# Patient Record
Sex: Female | Born: 1940 | Race: White | Hispanic: No | Marital: Married | Smoking: Never smoker
Health system: Southern US, Community
[De-identification: ages and names within clinical notes are randomized; demographics above are authoritative.]

## PROBLEM LIST (undated history)

## (undated) DIAGNOSIS — E7800 Pure hypercholesterolemia, unspecified: Secondary | ICD-10-CM

## (undated) DIAGNOSIS — Z9889 Other specified postprocedural states: Secondary | ICD-10-CM

## (undated) DIAGNOSIS — I10 Essential (primary) hypertension: Secondary | ICD-10-CM

## (undated) DIAGNOSIS — T8859XA Other complications of anesthesia, initial encounter: Secondary | ICD-10-CM

## (undated) DIAGNOSIS — M810 Age-related osteoporosis without current pathological fracture: Secondary | ICD-10-CM

## (undated) DIAGNOSIS — K635 Polyp of colon: Secondary | ICD-10-CM

## (undated) DIAGNOSIS — M419 Scoliosis, unspecified: Secondary | ICD-10-CM

## (undated) DIAGNOSIS — R112 Nausea with vomiting, unspecified: Secondary | ICD-10-CM

## (undated) DIAGNOSIS — M5431 Sciatica, right side: Secondary | ICD-10-CM

## (undated) DIAGNOSIS — T4145XA Adverse effect of unspecified anesthetic, initial encounter: Secondary | ICD-10-CM

## (undated) DIAGNOSIS — E119 Type 2 diabetes mellitus without complications: Secondary | ICD-10-CM

## (undated) HISTORY — PX: OTHER SURGICAL HISTORY: SHX169

## (undated) HISTORY — PX: COLONOSCOPY: SHX174

## (undated) HISTORY — PX: ABDOMINAL HYSTERECTOMY: SHX81

---
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
## 2000-08-03 ENCOUNTER — Ambulatory Visit (HOSPITAL_COMMUNITY): Admission: RE | Admit: 2000-08-03 | Discharge: 2000-08-03 | Payer: Self-pay | Admitting: *Deleted

## 2000-08-03 ENCOUNTER — Encounter: Payer: Self-pay | Admitting: *Deleted

## 2001-12-18 ENCOUNTER — Encounter: Payer: Self-pay | Admitting: Family Medicine

## 2001-12-18 ENCOUNTER — Ambulatory Visit (HOSPITAL_COMMUNITY): Admission: RE | Admit: 2001-12-18 | Discharge: 2001-12-18 | Payer: Self-pay | Admitting: Family Medicine

## 2002-05-14 ENCOUNTER — Encounter: Payer: Self-pay | Admitting: Family Medicine

## 2002-05-14 ENCOUNTER — Ambulatory Visit (HOSPITAL_COMMUNITY): Admission: RE | Admit: 2002-05-14 | Discharge: 2002-05-14 | Payer: Self-pay | Admitting: Family Medicine

## 2003-05-05 ENCOUNTER — Ambulatory Visit (HOSPITAL_COMMUNITY): Admission: RE | Admit: 2003-05-05 | Discharge: 2003-05-05 | Payer: Self-pay | Admitting: Obstetrics and Gynecology

## 2004-07-04 ENCOUNTER — Ambulatory Visit (HOSPITAL_COMMUNITY): Admission: RE | Admit: 2004-07-04 | Discharge: 2004-07-04 | Payer: Self-pay | Admitting: Orthopaedic Surgery

## 2005-07-11 ENCOUNTER — Ambulatory Visit (HOSPITAL_COMMUNITY): Admission: RE | Admit: 2005-07-11 | Discharge: 2005-07-11 | Payer: Self-pay | Admitting: Family Medicine

## 2006-08-13 ENCOUNTER — Ambulatory Visit (HOSPITAL_COMMUNITY): Admission: RE | Admit: 2006-08-13 | Discharge: 2006-08-13 | Payer: Self-pay | Admitting: Family Medicine

## 2007-05-01 ENCOUNTER — Ambulatory Visit (HOSPITAL_COMMUNITY): Admission: RE | Admit: 2007-05-01 | Discharge: 2007-05-01 | Payer: Self-pay | Admitting: Family Medicine

## 2007-08-22 ENCOUNTER — Ambulatory Visit (HOSPITAL_COMMUNITY): Admission: RE | Admit: 2007-08-22 | Discharge: 2007-08-22 | Payer: Self-pay | Admitting: Family Medicine

## 2008-10-07 ENCOUNTER — Ambulatory Visit (HOSPITAL_COMMUNITY): Admission: RE | Admit: 2008-10-07 | Discharge: 2008-10-07 | Payer: Self-pay | Admitting: Family Medicine

## 2009-04-27 ENCOUNTER — Ambulatory Visit (HOSPITAL_COMMUNITY): Admission: RE | Admit: 2009-04-27 | Discharge: 2009-04-27 | Payer: Self-pay | Admitting: Family Medicine

## 2009-10-26 ENCOUNTER — Ambulatory Visit (HOSPITAL_COMMUNITY): Admission: RE | Admit: 2009-10-26 | Discharge: 2009-10-26 | Payer: Self-pay | Admitting: Family Medicine

## 2010-10-18 ENCOUNTER — Other Ambulatory Visit (HOSPITAL_BASED_OUTPATIENT_CLINIC_OR_DEPARTMENT_OTHER): Payer: Self-pay | Admitting: Family Medicine

## 2010-10-18 DIAGNOSIS — Z139 Encounter for screening, unspecified: Secondary | ICD-10-CM

## 2010-11-01 ENCOUNTER — Ambulatory Visit (HOSPITAL_COMMUNITY)
Admission: RE | Admit: 2010-11-01 | Discharge: 2010-11-01 | Disposition: A | Discharge status: Home / Self Care | Payer: Medicare Other | Source: Ambulatory Visit | Attending: Family Medicine | Admitting: Family Medicine

## 2010-11-01 DIAGNOSIS — Z1231 Encounter for screening mammogram for malignant neoplasm of breast: Secondary | ICD-10-CM | POA: Insufficient documentation

## 2010-11-01 DIAGNOSIS — Z139 Encounter for screening, unspecified: Secondary | ICD-10-CM

## 2011-06-22 ENCOUNTER — Other Ambulatory Visit (HOSPITAL_COMMUNITY): Payer: Self-pay | Admitting: Family Medicine

## 2011-06-22 DIAGNOSIS — Z139 Encounter for screening, unspecified: Secondary | ICD-10-CM

## 2011-06-22 DIAGNOSIS — D4860 Neoplasm of uncertain behavior of unspecified breast: Secondary | ICD-10-CM

## 2011-06-22 DIAGNOSIS — Z01419 Encounter for gynecological examination (general) (routine) without abnormal findings: Secondary | ICD-10-CM

## 2011-06-28 ENCOUNTER — Ambulatory Visit (HOSPITAL_COMMUNITY)
Admission: RE | Admit: 2011-06-28 | Discharge: 2011-06-28 | Disposition: A | Discharge status: Home / Self Care | Payer: Medicare Other | Source: Ambulatory Visit | Attending: Family Medicine | Admitting: Family Medicine

## 2011-06-28 DIAGNOSIS — Z01419 Encounter for gynecological examination (general) (routine) without abnormal findings: Secondary | ICD-10-CM

## 2011-06-28 DIAGNOSIS — Z13820 Encounter for screening for osteoporosis: Secondary | ICD-10-CM | POA: Insufficient documentation

## 2011-06-28 DIAGNOSIS — M818 Other osteoporosis without current pathological fracture: Secondary | ICD-10-CM | POA: Insufficient documentation

## 2011-06-28 DIAGNOSIS — Z780 Asymptomatic menopausal state: Secondary | ICD-10-CM | POA: Insufficient documentation

## 2011-06-28 DIAGNOSIS — Z139 Encounter for screening, unspecified: Secondary | ICD-10-CM

## 2011-07-05 ENCOUNTER — Ambulatory Visit (HOSPITAL_COMMUNITY)
Admission: RE | Admit: 2011-07-05 | Discharge: 2011-07-05 | Disposition: A | Discharge status: Home / Self Care | Payer: Medicare Other | Source: Ambulatory Visit | Attending: Family Medicine | Admitting: Family Medicine

## 2011-07-05 ENCOUNTER — Ambulatory Visit (HOSPITAL_COMMUNITY): Payer: Medicare Other

## 2011-07-05 ENCOUNTER — Other Ambulatory Visit (HOSPITAL_COMMUNITY): Payer: Self-pay | Admitting: Family Medicine

## 2011-07-05 DIAGNOSIS — D4860 Neoplasm of uncertain behavior of unspecified breast: Secondary | ICD-10-CM

## 2011-07-05 DIAGNOSIS — N63 Unspecified lump in unspecified breast: Secondary | ICD-10-CM | POA: Insufficient documentation

## 2011-10-10 ENCOUNTER — Other Ambulatory Visit (HOSPITAL_COMMUNITY): Payer: Self-pay | Admitting: Family Medicine

## 2011-10-10 DIAGNOSIS — Z139 Encounter for screening, unspecified: Secondary | ICD-10-CM

## 2011-11-06 ENCOUNTER — Ambulatory Visit (HOSPITAL_COMMUNITY)
Admission: RE | Admit: 2011-11-06 | Discharge: 2011-11-06 | Disposition: A | Discharge status: Home / Self Care | Payer: Medicare Other | Source: Ambulatory Visit | Attending: Family Medicine | Admitting: Family Medicine

## 2011-11-06 DIAGNOSIS — Z1231 Encounter for screening mammogram for malignant neoplasm of breast: Secondary | ICD-10-CM | POA: Insufficient documentation

## 2011-11-06 DIAGNOSIS — Z139 Encounter for screening, unspecified: Secondary | ICD-10-CM

## 2012-05-13 ENCOUNTER — Encounter (INDEPENDENT_AMBULATORY_CARE_PROVIDER_SITE_OTHER): Payer: Self-pay | Admitting: *Deleted

## 2012-05-14 ENCOUNTER — Encounter (INDEPENDENT_AMBULATORY_CARE_PROVIDER_SITE_OTHER): Payer: Self-pay

## 2012-07-18 ENCOUNTER — Telehealth (HOSPITAL_COMMUNITY): Payer: Self-pay | Admitting: Dietician

## 2012-07-18 NOTE — Telephone Encounter (Signed)
Received voicemail left at [phone]. Pt requesting to attend next Tuesday group DM class.

## 2012-07-18 NOTE — Telephone Encounter (Signed)
Called back at [phone]. Pt registered to attend group DM class on 07/30/12 at 1000.

## 2012-07-30 ENCOUNTER — Encounter (HOSPITAL_COMMUNITY): Payer: Self-pay | Admitting: Dietician

## 2012-07-30 NOTE — Progress Notes (Signed)
[facility] Diabetes Class Completion  Date:July 30, 2012  Time: 1000  Pt attended [facility]'s Diabetes Group Education Class on July 30, 2012.   Patient was educated on the following topics:   -Survival skills (signs and symptoms of hyperglycemia and hypoglycemia, treatment for hypoglycemia, ideal levels for fasting and postprandial blood sugars, goal Hgb A1c level, foot care basics)  -Recommendations for physical activity   -Carbohydrate metabolism in relation to diabetes   -Meal planning (sources of carbohydrate, carbohydrate counting, meal planning strategies, food label reading, and portion control).  Handouts provided:  -"Diabetes and You: Taking Charge of Your Health"  -"Carbohydrate Counting and Meal Planning"  -"Blood Sugar Diary"  -"Your Guide to Better Office Visits"   Shelia Pierce, RD, LDN   

## 2012-10-10 ENCOUNTER — Telehealth (INDEPENDENT_AMBULATORY_CARE_PROVIDER_SITE_OTHER): Payer: Self-pay | Admitting: *Deleted

## 2012-10-10 ENCOUNTER — Other Ambulatory Visit (INDEPENDENT_AMBULATORY_CARE_PROVIDER_SITE_OTHER): Payer: Self-pay | Admitting: *Deleted

## 2012-10-10 DIAGNOSIS — Z86010 Personal history of colonic polyps: Secondary | ICD-10-CM

## 2012-10-10 DIAGNOSIS — Z1211 Encounter for screening for malignant neoplasm of colon: Secondary | ICD-10-CM

## 2012-10-10 NOTE — Telephone Encounter (Signed)
Patient needs movi prep 

## 2012-10-11 MED ORDER — PEG-KCL-NACL-NASULF-NA ASC-C 100 G PO SOLR: 1.0000 | Freq: Once | ORAL | Status: DC

## 2012-10-29 ENCOUNTER — Other Ambulatory Visit (HOSPITAL_COMMUNITY): Payer: Self-pay | Admitting: Family Medicine

## 2012-10-29 DIAGNOSIS — Z139 Encounter for screening, unspecified: Secondary | ICD-10-CM

## 2012-10-31 ENCOUNTER — Telehealth (INDEPENDENT_AMBULATORY_CARE_PROVIDER_SITE_OTHER): Payer: Self-pay | Admitting: *Deleted

## 2012-10-31 NOTE — Telephone Encounter (Signed)
agree

## 2012-10-31 NOTE — Telephone Encounter (Signed)
  Procedure: tcs  Reason/Indication:  Hx polyps  Has patient had this procedure before?  Yes, 2009 -- scanned  If so, when, by whom and where?    Is there a family history of colon cancer?  no  Who?  What age when diagnosed?    Is patient diabetic?   Yes, diet controlled      Does patient have prosthetic heart valve?  no  Do you have a pacemaker?  no  Has patient ever had endocarditis? no  Has patient had joint replacement within last 12 months?  no  Does patient tend to be constipated or take laxatives? no  Is patient on Coumadin, Plavix and/or Aspirin? yes  Medications: asa 81 mg daily, vit d 1000 iu daily, fish oil 1000 mg bid, multi vit daily, red yeast rice 1200 mg daily  Allergies: nkda  Medication Adjustment: asa 2 days  Procedure date & time: 11/21/12 at 1030

## 2012-11-07 ENCOUNTER — Encounter (HOSPITAL_COMMUNITY): Payer: Self-pay | Admitting: Pharmacy Technician

## 2012-11-07 ENCOUNTER — Ambulatory Visit (HOSPITAL_COMMUNITY)
Admission: RE | Admit: 2012-11-07 | Discharge: 2012-11-07 | Disposition: A | Discharge status: Home / Self Care | Payer: Medicare Other | Source: Ambulatory Visit | Attending: Family Medicine | Admitting: Family Medicine

## 2012-11-07 DIAGNOSIS — Z1231 Encounter for screening mammogram for malignant neoplasm of breast: Secondary | ICD-10-CM | POA: Insufficient documentation

## 2012-11-07 DIAGNOSIS — Z139 Encounter for screening, unspecified: Secondary | ICD-10-CM

## 2012-11-21 ENCOUNTER — Encounter (HOSPITAL_COMMUNITY): Payer: Self-pay | Admitting: *Deleted

## 2012-11-21 ENCOUNTER — Ambulatory Visit (HOSPITAL_COMMUNITY)
Admission: RE | Admit: 2012-11-21 | Discharge: 2012-11-21 | Disposition: A | Discharge status: Home / Self Care | Payer: Medicare Other | Source: Ambulatory Visit | Attending: Internal Medicine | Admitting: Internal Medicine

## 2012-11-21 ENCOUNTER — Encounter (HOSPITAL_COMMUNITY)
Admission: RE | Disposition: A | Discharge status: Home / Self Care | Payer: Self-pay | Source: Ambulatory Visit | Attending: Internal Medicine

## 2012-11-21 DIAGNOSIS — Z01812 Encounter for preprocedural laboratory examination: Secondary | ICD-10-CM | POA: Insufficient documentation

## 2012-11-21 DIAGNOSIS — K5730 Diverticulosis of large intestine without perforation or abscess without bleeding: Secondary | ICD-10-CM | POA: Insufficient documentation

## 2012-11-21 DIAGNOSIS — D126 Benign neoplasm of colon, unspecified: Secondary | ICD-10-CM

## 2012-11-21 DIAGNOSIS — E119 Type 2 diabetes mellitus without complications: Secondary | ICD-10-CM | POA: Insufficient documentation

## 2012-11-21 DIAGNOSIS — K644 Residual hemorrhoidal skin tags: Secondary | ICD-10-CM

## 2012-11-21 DIAGNOSIS — Z860100 Personal history of colon polyps, unspecified: Secondary | ICD-10-CM | POA: Insufficient documentation

## 2012-11-21 HISTORY — DX: Polyp of colon: K63.5

## 2012-11-21 HISTORY — PX: COLONOSCOPY: SHX5424

## 2012-11-21 HISTORY — DX: Pure hypercholesterolemia, unspecified: E78.00

## 2012-11-21 HISTORY — DX: Type 2 diabetes mellitus without complications: E11.9

## 2012-11-21 SURGERY — COLONOSCOPY
Anesthesia: Moderate Sedation

## 2012-11-21 MED ORDER — MIDAZOLAM HCL 5 MG/5ML IJ SOLN
INTRAMUSCULAR | Status: AC
  Filled 2012-11-21: qty 10

## 2012-11-21 MED ORDER — SODIUM CHLORIDE 0.9 % IV SOLN
INTRAVENOUS | Status: DC
  Administered 2012-11-21: 10:00:00 via INTRAVENOUS

## 2012-11-21 MED ORDER — MEPERIDINE HCL 50 MG/ML IJ SOLN
INTRAMUSCULAR | Status: AC
  Filled 2012-11-21: qty 1

## 2012-11-21 MED ORDER — MIDAZOLAM HCL 5 MG/5ML IJ SOLN
INTRAMUSCULAR | Status: DC | PRN
  Administered 2012-11-21 (×2): 2 mg via INTRAVENOUS
  Administered 2012-11-21: 1 mg via INTRAVENOUS

## 2012-11-21 MED ORDER — STERILE WATER FOR IRRIGATION IR SOLN
Status: DC | PRN
  Administered 2012-11-21: 11:00:00

## 2012-11-21 MED ORDER — MEPERIDINE HCL 50 MG/ML IJ SOLN
INTRAMUSCULAR | Status: DC | PRN
  Administered 2012-11-21 (×2): 25 mg via INTRAVENOUS

## 2012-11-21 NOTE — Op Note (Signed)
COLONOSCOPY PROCEDURE REPORT  PATIENT:  Shelia Pierce  MR#:  [id number] Birthdate:  1940-07-23, 72 y.o., female Endoscopist:  Dr. Malissa Hippo, MD Referred By:  Dr. Colette Ribas, MD  Procedure Date: 11/21/2012  Procedure:   Colonoscopy  Indications:  Patient is 72 year old Caucasian female with history of colonic adenomas and is here for surveillance colonoscopy.  Informed Consent:  The procedure and risks were reviewed with the patient and informed consent was obtained.  Medications:  Demerol 50 mg IV Versed 5 mg IV  Description of procedure:  After a digital rectal exam was performed, that colonoscope was advanced from the anus through the rectum and colon to the area of the cecum, ileocecal valve and appendiceal orifice. The cecum was deeply intubated. These structures were well-seen and photographed for the record. From the level of the cecum and ileocecal valve, the scope was slowly and cautiously withdrawn. The mucosal surfaces were carefully surveyed utilizing scope tip to flexion to facilitate fold flattening as needed. The scope was pulled down into the rectum where a thorough exam including retroflexion was performed.  Findings:   Prep excellent. 3 mm polyp ablated via cold biopsy from the hepatic flexure. Scattered diverticula and sigmoid and descending colon. Normal rectal mucosa. Small hemorrhoids below the dentate line.    Therapeutic/Diagnostic Maneuvers Performed:  See above  Complications:  None  Cecal Withdrawal Time:  13  minutes  Impression:  Examination performed to cecum. 3 mm polyp ablated via cold biopsy from hepatic flexure. Left-sided diverticulosis. Small external hemorrhoids.  Recommendations:  Standard instructions given. I will contact patient with biopsy results and further recommendations.  REHMAN,NAJEEB U  11/21/2012 11:19 AM  CC: Dr. Phillips Odor, Chancy Hurter, MD & Dr. Bonnetta Barry ref. provider found

## 2012-11-21 NOTE — H&P (Signed)
Shelia Pierce is an 72 y.o. female.   Chief Complaint: Patient is here for colonoscopy. HPI: Patient is 72 year old Caucasian female with history of tubular adenomas and is for surveillance colonoscopy. Her last exam was in August 2009. She denies abdominal pain change in her bowel habits or rectal bleeding. Family history is negative for CRC.  Past Medical History  Diagnosis Date  . Hypercholesteremia   . Colon polyps   . Diabetes mellitus without complication     Diet controlled    Past Surgical History  Procedure Laterality Date  . Abdominal hysterectomy    . Colonoscopy    . Left foot    . Left breast biopsy      Family History  Problem Relation Age of Onset  . Colon cancer Neg Hx    Social History:  reports that she has never smoked. She does not have any smokeless tobacco history on file. She reports that she does not drink alcohol or use illicit drugs.  Allergies: No Known Allergies  Medications Prior to Admission  Medication Sig Dispense Refill  . aspirin EC 81 MG tablet Take 81 mg by mouth daily.      . cholecalciferol (VITAMIN D) 1000 UNITS tablet Take 1,000 Units by mouth daily.      Marland Kitchen ibuprofen (ADVIL,MOTRIN) 200 MG tablet Take 400 mg by mouth every 6 (six) hours as needed.      . Multiple Vitamin (MULTIVITAMIN WITH MINERALS) TABS tablet Take 1 tablet by mouth daily.      Marland Kitchen omega-3 acid ethyl esters (LOVAZA) 1 G capsule Take 2 g by mouth daily.      . peg 3350 powder (MOVIPREP) 100 G SOLR Take 1 kit (200 g total) by mouth once.  1 kit  0  . Red Yeast Rice Extract 600 MG CAPS Take 1,200 mg by mouth daily.        Results for orders placed during the hospital encounter of 11/21/12 (from the past 48 hour(s))  GLUCOSE, CAPILLARY     Status: Abnormal   Collection Time    11/21/12 10:03 AM      Result Value Range   Glucose-Capillary 105 (*) 70 - 99 mg/dL   Comment 1 Documented in Chart     No results found.  ROS  Blood pressure 167/83, pulse 63, temperature  97.8 F (36.6 C), temperature source Oral, resp. rate 15, height 5' (1.524 m), weight 159 lb (72.122 kg), SpO2 98.00%. Physical Exam  Constitutional: She appears well-developed and well-nourished.  HENT:  Mouth/Throat: Oropharynx is clear and moist.  Eyes: Conjunctivae are normal. No scleral icterus.  Neck: No thyromegaly present.  Cardiovascular: Normal rate, regular rhythm and normal heart sounds.   No murmur heard. Respiratory: Effort normal and breath sounds normal.  GI: Soft. She exhibits no distension and no mass. There is no tenderness.  Musculoskeletal: She exhibits no edema.  Lymphadenopathy:    She has no cervical adenopathy.  Neurological: She is alert.  Skin: Skin is warm and dry.     Assessment/Plan History of colonic adenomas. Surveillance colonoscopy.  REHMAN,NAJEEB U 11/21/2012, 10:45 AM

## 2012-11-26 ENCOUNTER — Encounter (HOSPITAL_COMMUNITY): Payer: Self-pay | Admitting: Internal Medicine

## 2012-12-06 ENCOUNTER — Encounter (INDEPENDENT_AMBULATORY_CARE_PROVIDER_SITE_OTHER): Payer: Self-pay | Admitting: *Deleted

## 2013-10-13 ENCOUNTER — Other Ambulatory Visit (HOSPITAL_COMMUNITY): Payer: Self-pay | Admitting: Family Medicine

## 2013-10-13 DIAGNOSIS — Z1231 Encounter for screening mammogram for malignant neoplasm of breast: Secondary | ICD-10-CM

## 2013-11-10 ENCOUNTER — Ambulatory Visit (HOSPITAL_COMMUNITY)
Admission: RE | Admit: 2013-11-10 | Discharge: 2013-11-10 | Disposition: A | Discharge status: Home / Self Care | Payer: Medicare Other | Source: Ambulatory Visit | Attending: Family Medicine | Admitting: Family Medicine

## 2013-11-10 DIAGNOSIS — Z1231 Encounter for screening mammogram for malignant neoplasm of breast: Secondary | ICD-10-CM | POA: Insufficient documentation

## 2014-05-05 DIAGNOSIS — H5213 Myopia, bilateral: Secondary | ICD-10-CM | POA: Diagnosis not present

## 2014-05-05 DIAGNOSIS — H524 Presbyopia: Secondary | ICD-10-CM | POA: Diagnosis not present

## 2014-05-05 DIAGNOSIS — H52223 Regular astigmatism, bilateral: Secondary | ICD-10-CM | POA: Diagnosis not present

## 2014-05-05 DIAGNOSIS — H25819 Combined forms of age-related cataract, unspecified eye: Secondary | ICD-10-CM | POA: Diagnosis not present

## 2014-07-21 ENCOUNTER — Other Ambulatory Visit (HOSPITAL_COMMUNITY): Payer: Self-pay | Admitting: Family Medicine

## 2014-07-21 DIAGNOSIS — Z1389 Encounter for screening for other disorder: Secondary | ICD-10-CM | POA: Diagnosis not present

## 2014-07-21 DIAGNOSIS — M8580 Other specified disorders of bone density and structure, unspecified site: Secondary | ICD-10-CM

## 2014-07-21 DIAGNOSIS — Z6833 Body mass index (BMI) 33.0-33.9, adult: Secondary | ICD-10-CM | POA: Diagnosis not present

## 2014-07-21 DIAGNOSIS — E6609 Other obesity due to excess calories: Secondary | ICD-10-CM | POA: Diagnosis not present

## 2014-07-21 DIAGNOSIS — E782 Mixed hyperlipidemia: Secondary | ICD-10-CM | POA: Diagnosis not present

## 2014-07-21 DIAGNOSIS — Z0001 Encounter for general adult medical examination with abnormal findings: Secondary | ICD-10-CM | POA: Diagnosis not present

## 2014-07-21 DIAGNOSIS — E119 Type 2 diabetes mellitus without complications: Secondary | ICD-10-CM | POA: Diagnosis not present

## 2014-07-21 DIAGNOSIS — I10 Essential (primary) hypertension: Secondary | ICD-10-CM | POA: Diagnosis not present

## 2014-07-29 ENCOUNTER — Ambulatory Visit (HOSPITAL_COMMUNITY)
Admission: RE | Admit: 2014-07-29 | Discharge: 2014-07-29 | Disposition: A | Discharge status: Home / Self Care | Payer: Medicare Other | Source: Ambulatory Visit | Attending: Family Medicine | Admitting: Family Medicine

## 2014-07-29 DIAGNOSIS — Z780 Asymptomatic menopausal state: Secondary | ICD-10-CM | POA: Diagnosis not present

## 2014-07-29 DIAGNOSIS — Z1389 Encounter for screening for other disorder: Secondary | ICD-10-CM

## 2014-07-29 DIAGNOSIS — M8580 Other specified disorders of bone density and structure, unspecified site: Secondary | ICD-10-CM | POA: Diagnosis not present

## 2014-07-29 DIAGNOSIS — M810 Age-related osteoporosis without current pathological fracture: Secondary | ICD-10-CM | POA: Insufficient documentation

## 2014-07-29 DIAGNOSIS — R29890 Loss of height: Secondary | ICD-10-CM | POA: Insufficient documentation

## 2014-10-19 ENCOUNTER — Other Ambulatory Visit (HOSPITAL_COMMUNITY): Payer: Self-pay | Admitting: Family Medicine

## 2014-10-19 DIAGNOSIS — Z1231 Encounter for screening mammogram for malignant neoplasm of breast: Secondary | ICD-10-CM

## 2014-10-26 DIAGNOSIS — E119 Type 2 diabetes mellitus without complications: Secondary | ICD-10-CM | POA: Diagnosis not present

## 2014-10-26 DIAGNOSIS — E782 Mixed hyperlipidemia: Secondary | ICD-10-CM | POA: Diagnosis not present

## 2014-10-26 DIAGNOSIS — Z6830 Body mass index (BMI) 30.0-30.9, adult: Secondary | ICD-10-CM | POA: Diagnosis not present

## 2014-10-26 DIAGNOSIS — Z23 Encounter for immunization: Secondary | ICD-10-CM | POA: Diagnosis not present

## 2014-10-26 DIAGNOSIS — E6609 Other obesity due to excess calories: Secondary | ICD-10-CM | POA: Diagnosis not present

## 2014-10-26 DIAGNOSIS — I10 Essential (primary) hypertension: Secondary | ICD-10-CM | POA: Diagnosis not present

## 2014-10-26 DIAGNOSIS — Z1389 Encounter for screening for other disorder: Secondary | ICD-10-CM | POA: Diagnosis not present

## 2014-11-13 ENCOUNTER — Ambulatory Visit (HOSPITAL_COMMUNITY)
Admission: RE | Admit: 2014-11-13 | Discharge: 2014-11-13 | Disposition: A | Discharge status: Home / Self Care | Payer: Medicare Other | Source: Ambulatory Visit | Attending: Family Medicine | Admitting: Family Medicine

## 2014-11-13 DIAGNOSIS — Z1231 Encounter for screening mammogram for malignant neoplasm of breast: Secondary | ICD-10-CM | POA: Diagnosis not present

## 2015-05-07 DIAGNOSIS — E782 Mixed hyperlipidemia: Secondary | ICD-10-CM | POA: Diagnosis not present

## 2015-05-07 DIAGNOSIS — Z1389 Encounter for screening for other disorder: Secondary | ICD-10-CM | POA: Diagnosis not present

## 2015-05-07 DIAGNOSIS — E119 Type 2 diabetes mellitus without complications: Secondary | ICD-10-CM | POA: Diagnosis not present

## 2015-05-07 DIAGNOSIS — I10 Essential (primary) hypertension: Secondary | ICD-10-CM | POA: Diagnosis not present

## 2015-05-10 DIAGNOSIS — Z1389 Encounter for screening for other disorder: Secondary | ICD-10-CM | POA: Diagnosis not present

## 2015-05-10 DIAGNOSIS — E6609 Other obesity due to excess calories: Secondary | ICD-10-CM | POA: Diagnosis not present

## 2015-05-10 DIAGNOSIS — Z6831 Body mass index (BMI) 31.0-31.9, adult: Secondary | ICD-10-CM | POA: Diagnosis not present

## 2015-05-10 DIAGNOSIS — I10 Essential (primary) hypertension: Secondary | ICD-10-CM | POA: Diagnosis not present

## 2015-10-29 ENCOUNTER — Other Ambulatory Visit (HOSPITAL_COMMUNITY): Payer: Self-pay | Admitting: Family Medicine

## 2015-10-29 DIAGNOSIS — Z1231 Encounter for screening mammogram for malignant neoplasm of breast: Secondary | ICD-10-CM

## 2015-11-15 ENCOUNTER — Ambulatory Visit (HOSPITAL_COMMUNITY)
Admission: RE | Admit: 2015-11-15 | Discharge: 2015-11-15 | Disposition: A | Discharge status: Home / Self Care | Payer: Medicare Other | Source: Ambulatory Visit | Attending: Family Medicine | Admitting: Family Medicine

## 2015-11-15 DIAGNOSIS — Z1231 Encounter for screening mammogram for malignant neoplasm of breast: Secondary | ICD-10-CM | POA: Diagnosis not present

## 2015-11-16 DIAGNOSIS — H52223 Regular astigmatism, bilateral: Secondary | ICD-10-CM | POA: Diagnosis not present

## 2015-11-16 DIAGNOSIS — H5202 Hypermetropia, left eye: Secondary | ICD-10-CM | POA: Diagnosis not present

## 2015-11-16 DIAGNOSIS — H5211 Myopia, right eye: Secondary | ICD-10-CM | POA: Diagnosis not present

## 2015-11-16 DIAGNOSIS — E119 Type 2 diabetes mellitus without complications: Secondary | ICD-10-CM | POA: Diagnosis not present

## 2015-11-26 DIAGNOSIS — E782 Mixed hyperlipidemia: Secondary | ICD-10-CM | POA: Diagnosis not present

## 2015-11-26 DIAGNOSIS — Z1389 Encounter for screening for other disorder: Secondary | ICD-10-CM | POA: Diagnosis not present

## 2015-11-26 DIAGNOSIS — E119 Type 2 diabetes mellitus without complications: Secondary | ICD-10-CM | POA: Diagnosis not present

## 2015-11-26 DIAGNOSIS — Z0000 Encounter for general adult medical examination without abnormal findings: Secondary | ICD-10-CM | POA: Diagnosis not present

## 2016-08-10 DIAGNOSIS — Z1389 Encounter for screening for other disorder: Secondary | ICD-10-CM | POA: Diagnosis not present

## 2016-08-10 DIAGNOSIS — E782 Mixed hyperlipidemia: Secondary | ICD-10-CM | POA: Diagnosis not present

## 2016-08-10 DIAGNOSIS — E119 Type 2 diabetes mellitus without complications: Secondary | ICD-10-CM | POA: Diagnosis not present

## 2016-08-14 DIAGNOSIS — Z1389 Encounter for screening for other disorder: Secondary | ICD-10-CM | POA: Diagnosis not present

## 2016-08-14 DIAGNOSIS — E782 Mixed hyperlipidemia: Secondary | ICD-10-CM | POA: Diagnosis not present

## 2016-10-06 ENCOUNTER — Other Ambulatory Visit (HOSPITAL_COMMUNITY): Payer: Self-pay | Admitting: Family Medicine

## 2016-10-06 DIAGNOSIS — Z1231 Encounter for screening mammogram for malignant neoplasm of breast: Secondary | ICD-10-CM

## 2016-10-25 DIAGNOSIS — Z23 Encounter for immunization: Secondary | ICD-10-CM | POA: Diagnosis not present

## 2016-11-27 ENCOUNTER — Ambulatory Visit (HOSPITAL_COMMUNITY): Payer: Medicare Other

## 2016-11-29 ENCOUNTER — Ambulatory Visit (HOSPITAL_COMMUNITY)
Admission: RE | Admit: 2016-11-29 | Discharge: 2016-11-29 | Disposition: A | Discharge status: Home / Self Care | Payer: Medicare Other | Source: Ambulatory Visit | Attending: Family Medicine | Admitting: Family Medicine

## 2016-11-29 DIAGNOSIS — Z1231 Encounter for screening mammogram for malignant neoplasm of breast: Secondary | ICD-10-CM | POA: Diagnosis not present

## 2017-03-22 DIAGNOSIS — E119 Type 2 diabetes mellitus without complications: Secondary | ICD-10-CM | POA: Diagnosis not present

## 2017-03-22 DIAGNOSIS — H25813 Combined forms of age-related cataract, bilateral: Secondary | ICD-10-CM | POA: Diagnosis not present

## 2017-03-27 DIAGNOSIS — E1129 Type 2 diabetes mellitus with other diabetic kidney complication: Secondary | ICD-10-CM | POA: Diagnosis not present

## 2017-04-09 DIAGNOSIS — K648 Other hemorrhoids: Secondary | ICD-10-CM | POA: Diagnosis not present

## 2017-04-09 DIAGNOSIS — K625 Hemorrhage of anus and rectum: Secondary | ICD-10-CM | POA: Diagnosis not present

## 2017-04-09 DIAGNOSIS — Z1389 Encounter for screening for other disorder: Secondary | ICD-10-CM | POA: Diagnosis not present

## 2017-04-19 DIAGNOSIS — M4120 Other idiopathic scoliosis, site unspecified: Secondary | ICD-10-CM | POA: Diagnosis not present

## 2017-04-19 DIAGNOSIS — Z1389 Encounter for screening for other disorder: Secondary | ICD-10-CM | POA: Diagnosis not present

## 2017-04-19 DIAGNOSIS — Z0001 Encounter for general adult medical examination with abnormal findings: Secondary | ICD-10-CM | POA: Diagnosis not present

## 2017-04-19 DIAGNOSIS — E7849 Other hyperlipidemia: Secondary | ICD-10-CM | POA: Diagnosis not present

## 2017-04-19 DIAGNOSIS — E119 Type 2 diabetes mellitus without complications: Secondary | ICD-10-CM | POA: Diagnosis not present

## 2017-04-19 DIAGNOSIS — I10 Essential (primary) hypertension: Secondary | ICD-10-CM | POA: Diagnosis not present

## 2017-04-23 ENCOUNTER — Telehealth (INDEPENDENT_AMBULATORY_CARE_PROVIDER_SITE_OTHER): Payer: Self-pay | Admitting: Internal Medicine

## 2017-04-23 NOTE — Telephone Encounter (Signed)
Patient has an appt this week -  her insurance agent wanted a call back in ref to her [employer] - wanted to know if patient sees Deberah Castle would patient be billed under Dr Laural Golden such as a Tier 1 - his ph# [phone]

## 2017-04-24 DIAGNOSIS — E119 Type 2 diabetes mellitus without complications: Secondary | ICD-10-CM | POA: Diagnosis not present

## 2017-04-24 NOTE — Telephone Encounter (Signed)
Returned call to Shelia Pierce--LMOAM  Normally patients that have [facility] if NP sees patient it is billed under Dr. Laural Golden as billing and NP as rendering.  If this is not how they will pay Tier 1 then we will have to find an appt with Dr. Laural Golden.  I asked him to call back--he can leave me a message if any questions.

## 2017-04-24 NOTE — Telephone Encounter (Signed)
Ok, thank you

## 2017-04-25 ENCOUNTER — Encounter (INDEPENDENT_AMBULATORY_CARE_PROVIDER_SITE_OTHER): Payer: Self-pay | Admitting: Internal Medicine

## 2017-04-25 ENCOUNTER — Other Ambulatory Visit (HOSPITAL_COMMUNITY): Payer: Self-pay | Admitting: Family Medicine

## 2017-04-25 ENCOUNTER — Ambulatory Visit (INDEPENDENT_AMBULATORY_CARE_PROVIDER_SITE_OTHER): Payer: Medicare Other | Admitting: Internal Medicine

## 2017-04-25 VITALS — BP 164/90 | HR 68 | Temp 98.0°F | Ht 60.0 in | Wt 167.0 lb

## 2017-04-25 DIAGNOSIS — K625 Hemorrhage of anus and rectum: Secondary | ICD-10-CM | POA: Diagnosis not present

## 2017-04-25 DIAGNOSIS — E2839 Other primary ovarian failure: Secondary | ICD-10-CM

## 2017-04-25 IMAGING — MG 2D DIGITAL SCREENING BILATERAL MAMMOGRAM WITH CAD AND ADJUNCT TO
6 of 9 series · 6 of 25 positions shown · non-contrast
Comparison: Previous exam(s).

CLINICAL DATA: Screening.

EXAM:
2D DIGITAL SCREENING BILATERAL MAMMOGRAM WITH CAD AND ADJUNCT TOMO

[R MLO (1 of 2)]
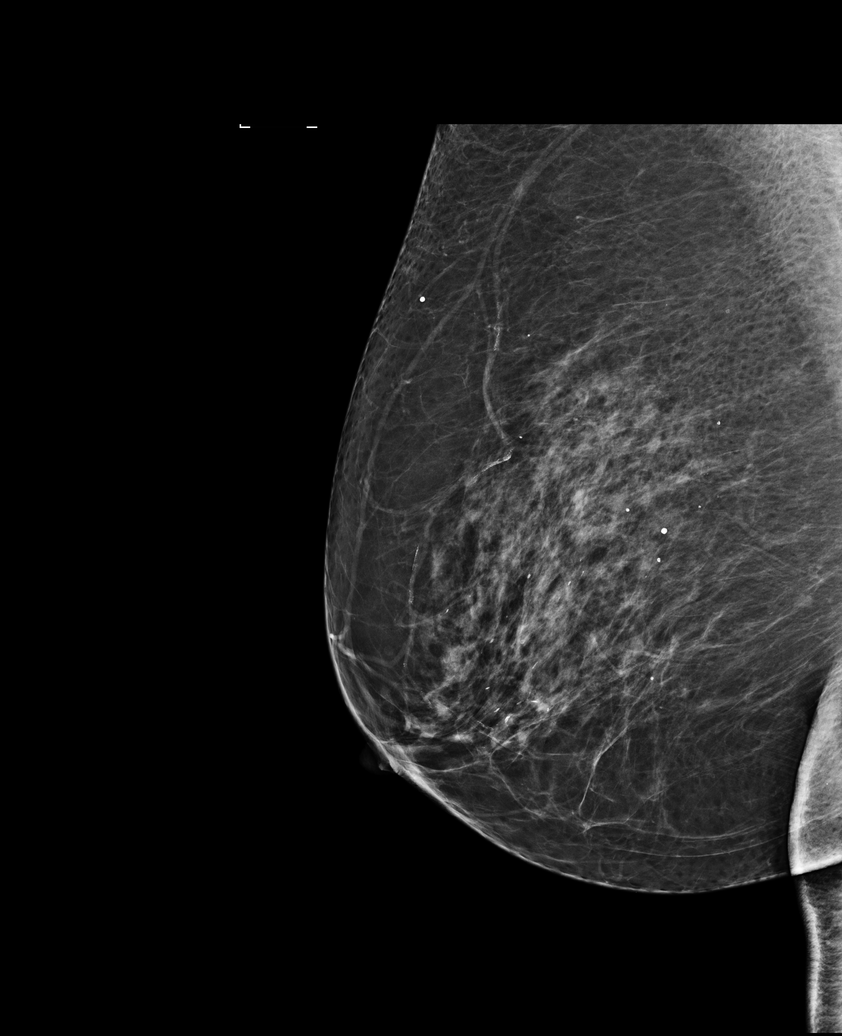

[L MLO]
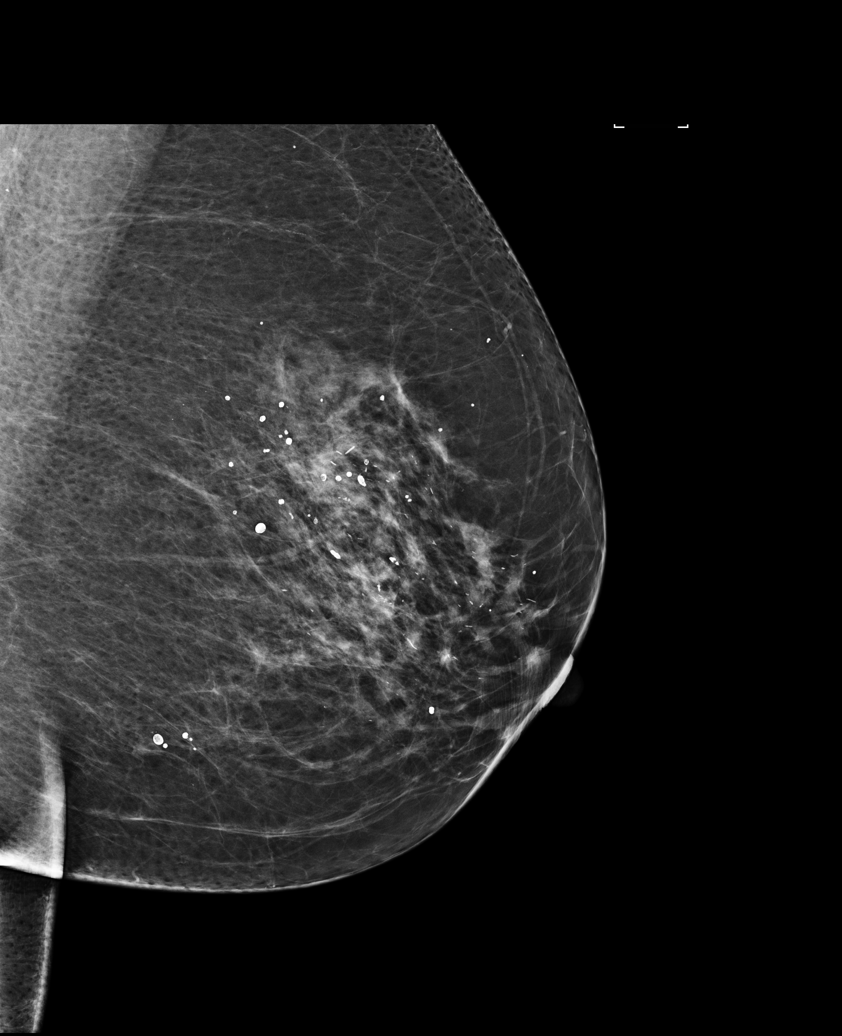

[R MLO (2 of 2)]
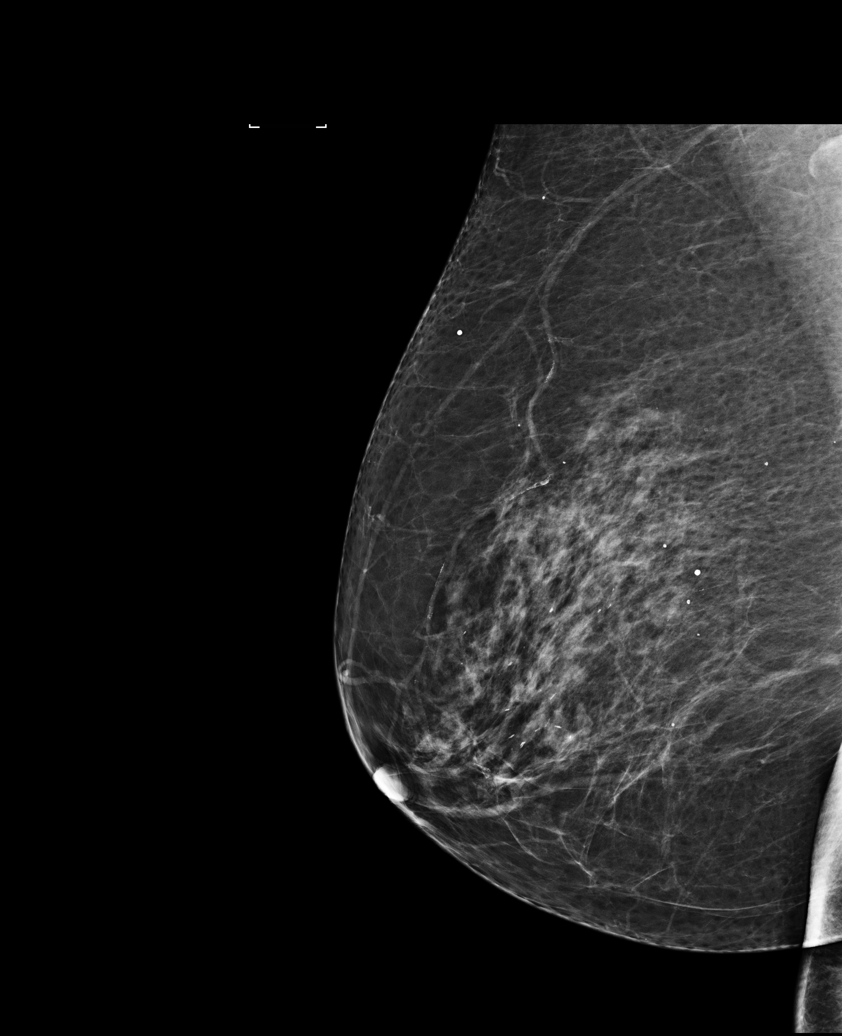

[R CC]
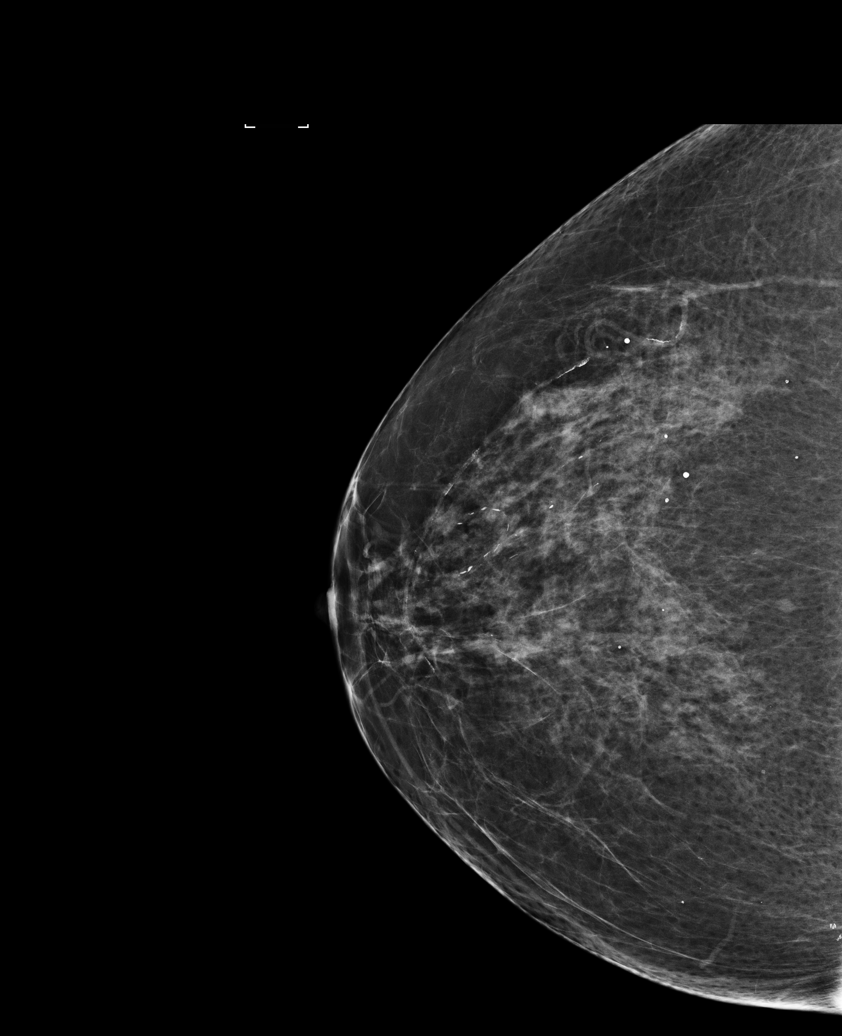

[L CC]
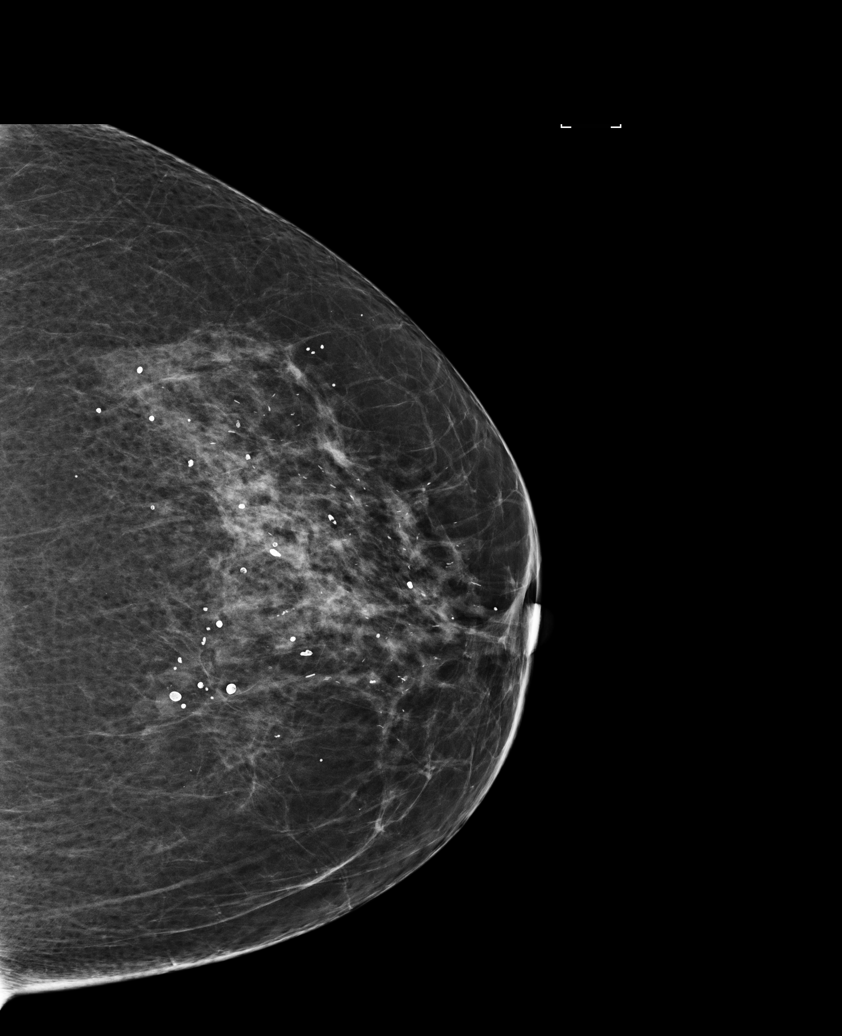

[R CC tomo · tomo slice 37/72.0]
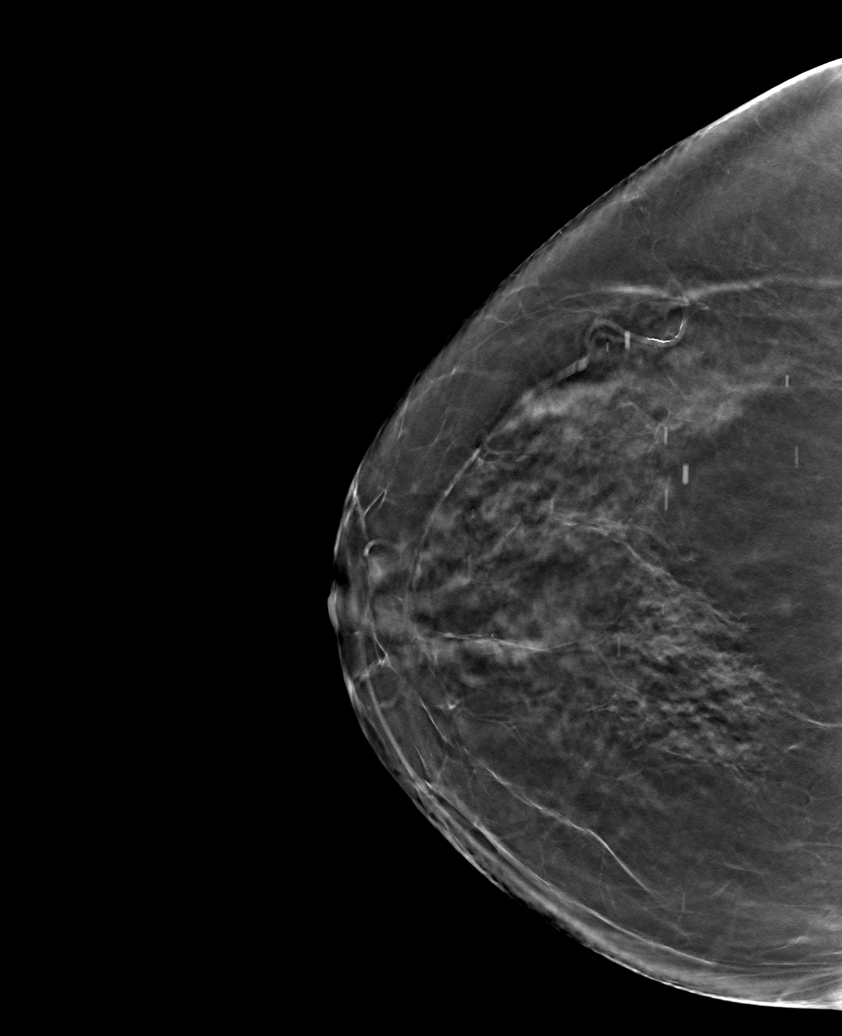

[6 of 25 positions shown; findings below may reference images not displayed]

ACR Breast Density Category b: There are scattered areas of
fibroglandular density.
FINDINGS: There are no findings suspicious for malignancy. Images were
processed with CAD.
IMPRESSION: No mammographic evidence of malignancy. A result letter of this
screening mammogram will be mailed directly to the patient.

RECOMMENDATION:
Screening mammogram in one year. (Code:97-6-RS4)

BI-RADS CATEGORY  1: Negative.

## 2017-04-25 NOTE — Progress Notes (Signed)
   Subjective:    Patient ID: Shelia Pierce, female    DOB: 13-Mar-1940, 77 y.o.   MRN: [id number]  HPI Referred by Dr. Gerarda Fraction for rectal bleeding/colonoscopy. Her last colonoscopy was in November of 2014 for hx of colonic adenomas.  Biopsy: No adenomatous changes. Next colonoscopy in 7 yrs.  She tells me she has some rectal bleeding about 2 weeks. She saw blood when she on the toilet tissue. She has seen blood a couple of time. There was no straining.  No family hx of colon cancer. Her appetite is good. No weight loss. BMs are normal.   Review of Systems     Past Medical History:  Diagnosis Date  . Colon polyps   . Diabetes mellitus without complication ([place])    Diet controlled  . Hypercholesteremia     Past Surgical History:  Procedure Laterality Date  . ABDOMINAL HYSTERECTOMY    . COLONOSCOPY    . COLONOSCOPY N/A 11/21/2012   Procedure: COLONOSCOPY;  Surgeon: Rogene Houston, MD;  Location: [facility];  Service: Endoscopy;  Laterality: N/A;  1030  . Left breast biopsy    . Left foot      No Known Allergies  Current Outpatient Medications on File Prior to Visit  Medication Sig Dispense Refill  . aspirin EC 81 MG tablet Take 81 mg by mouth daily.    . calcium-vitamin D (OSCAL WITH D) 500-200 MG-UNIT tablet Take 1 tablet by mouth.    Marland Kitchen lisinopril (PRINIVIL,ZESTRIL) 10 MG tablet Take 10 mg by mouth daily.    . metFORMIN (GLUCOPHAGE) 500 MG tablet Take by mouth 2 (two) times daily with a meal.    . Multiple Vitamin (MULTIVITAMIN WITH MINERALS) TABS tablet Take 1 tablet by mouth daily.    Marland Kitchen omega-3 acid ethyl esters (LOVAZA) 1 G capsule Take 2 g by mouth daily.    . simvastatin (ZOCOR) 10 MG tablet Take 10 mg by mouth daily.    . vitamin A 8000 UNIT capsule Take 8,000 Units by mouth daily.    . vitamin B-12 (CYANOCOBALAMIN) 500 MCG tablet Take 500 mcg by mouth daily.    . vitamin C (ASCORBIC ACID) 500 MG tablet Take 500 mg by mouth daily.    . cholecalciferol (VITAMIN D)  1000 UNITS tablet Take 1,000 Units by mouth daily.    Marland Kitchen ibuprofen (ADVIL,MOTRIN) 200 MG tablet Take 400 mg by mouth every 6 (six) hours as needed.     No current facility-administered medications on file prior to visit.      Objective:   Physical Exam Blood pressure (!) 164/90, pulse 68, temperature 98 F (36.7 C), height 5' (1.524 m), weight 167 lb (75.8 kg). Alert and oriented. Skin warm and dry. Oral mucosa is moist.   . Sclera anicteric, conjunctivae is pink. Thyroid not enlarged. No cervical lymphadenopathy. Lungs clear. Heart regular rate and rhythm.  Abdomen is soft. Bowel sounds are positive. No hepatomegaly. No abdominal masses felt. No tenderness.  No edema to lower extremities.   Stool brown and guaiac negative.        Assessment & Plan:  Rectal bleeding. Colonic neoplasm needs to be ruled out. The risks of bleeding, perforation and infection were reviewed with patient.

## 2017-04-25 NOTE — Patient Instructions (Signed)
The risks of bleeding, perforation and infection were reviewed with patient.  

## 2017-04-26 ENCOUNTER — Encounter (INDEPENDENT_AMBULATORY_CARE_PROVIDER_SITE_OTHER): Payer: Self-pay | Admitting: *Deleted

## 2017-04-26 ENCOUNTER — Telehealth (INDEPENDENT_AMBULATORY_CARE_PROVIDER_SITE_OTHER): Payer: Self-pay | Admitting: *Deleted

## 2017-04-26 DIAGNOSIS — K625 Hemorrhage of anus and rectum: Secondary | ICD-10-CM | POA: Insufficient documentation

## 2017-04-26 MED ORDER — PEG 3350-KCL-NA BICARB-NACL 420 G PO SOLR
4000.0000 mL | Freq: Once | ORAL | 0 refills | Status: AC
Start: 2017-04-26 — End: 2017-04-26

## 2017-04-26 NOTE — Telephone Encounter (Signed)
Patient needs trilyte 

## 2017-05-02 ENCOUNTER — Ambulatory Visit (HOSPITAL_COMMUNITY)
Admission: RE | Admit: 2017-05-02 | Discharge: 2017-05-02 | Disposition: A | Discharge status: Home / Self Care | Payer: Medicare Other | Source: Ambulatory Visit | Attending: Family Medicine | Admitting: Family Medicine

## 2017-05-02 DIAGNOSIS — E2839 Other primary ovarian failure: Secondary | ICD-10-CM

## 2017-05-02 DIAGNOSIS — M810 Age-related osteoporosis without current pathological fracture: Secondary | ICD-10-CM | POA: Diagnosis not present

## 2017-05-02 DIAGNOSIS — M8589 Other specified disorders of bone density and structure, multiple sites: Secondary | ICD-10-CM | POA: Diagnosis not present

## 2017-05-25 ENCOUNTER — Encounter (HOSPITAL_COMMUNITY)
Admission: RE | Disposition: A | Discharge status: Home / Self Care | Payer: Self-pay | Source: Ambulatory Visit | Attending: Internal Medicine

## 2017-05-25 ENCOUNTER — Encounter (HOSPITAL_COMMUNITY): Payer: Self-pay | Admitting: Emergency Medicine

## 2017-05-25 ENCOUNTER — Other Ambulatory Visit: Payer: Self-pay

## 2017-05-25 ENCOUNTER — Ambulatory Visit (HOSPITAL_COMMUNITY)
Admission: RE | Admit: 2017-05-25 | Discharge: 2017-05-25 | Disposition: A | Discharge status: Home / Self Care | Payer: Medicare Other | Source: Ambulatory Visit | Attending: Internal Medicine | Admitting: Internal Medicine

## 2017-05-25 DIAGNOSIS — Z7984 Long term (current) use of oral hypoglycemic drugs: Secondary | ICD-10-CM | POA: Insufficient documentation

## 2017-05-25 DIAGNOSIS — Z7982 Long term (current) use of aspirin: Secondary | ICD-10-CM | POA: Insufficient documentation

## 2017-05-25 DIAGNOSIS — K648 Other hemorrhoids: Secondary | ICD-10-CM | POA: Diagnosis not present

## 2017-05-25 DIAGNOSIS — K5730 Diverticulosis of large intestine without perforation or abscess without bleeding: Secondary | ICD-10-CM | POA: Insufficient documentation

## 2017-05-25 DIAGNOSIS — Z800 Family history of malignant neoplasm of digestive organs: Secondary | ICD-10-CM | POA: Diagnosis not present

## 2017-05-25 DIAGNOSIS — Z86010 Personal history of colonic polyps: Secondary | ICD-10-CM | POA: Diagnosis not present

## 2017-05-25 DIAGNOSIS — E119 Type 2 diabetes mellitus without complications: Secondary | ICD-10-CM | POA: Diagnosis not present

## 2017-05-25 DIAGNOSIS — R194 Change in bowel habit: Secondary | ICD-10-CM | POA: Diagnosis not present

## 2017-05-25 DIAGNOSIS — E7800 Pure hypercholesterolemia, unspecified: Secondary | ICD-10-CM | POA: Insufficient documentation

## 2017-05-25 DIAGNOSIS — I10 Essential (primary) hypertension: Secondary | ICD-10-CM | POA: Insufficient documentation

## 2017-05-25 DIAGNOSIS — K625 Hemorrhage of anus and rectum: Secondary | ICD-10-CM | POA: Diagnosis not present

## 2017-05-25 DIAGNOSIS — Z79899 Other long term (current) drug therapy: Secondary | ICD-10-CM | POA: Diagnosis not present

## 2017-05-25 DIAGNOSIS — K921 Melena: Secondary | ICD-10-CM | POA: Diagnosis not present

## 2017-05-25 HISTORY — DX: Essential (primary) hypertension: I10

## 2017-05-25 HISTORY — PX: COLONOSCOPY: SHX5424

## 2017-05-25 LAB — GLUCOSE, CAPILLARY: Glucose-Capillary: 121 mg/dL — ABNORMAL HIGH (ref 65–99)

## 2017-05-25 SURGERY — COLONOSCOPY
Anesthesia: Moderate Sedation

## 2017-05-25 MED ORDER — HYDROCORTISONE ACETATE 25 MG RE SUPP: 25.0000 mg | Freq: Every day | RECTAL | 1 refills | Status: DC

## 2017-05-25 MED ORDER — MIDAZOLAM HCL 5 MG/5ML IJ SOLN
INTRAMUSCULAR | Status: DC | PRN
  Administered 2017-05-25: 2 mg via INTRAVENOUS
  Administered 2017-05-25 (×2): 1 mg via INTRAVENOUS
  Administered 2017-05-25: 2 mg via INTRAVENOUS

## 2017-05-25 MED ORDER — SODIUM CHLORIDE 0.9 % IV SOLN: INTRAVENOUS | Status: DC

## 2017-05-25 MED ORDER — BENEFIBER DRINK MIX PO PACK: 4.0000 g | PACK | Freq: Every day | ORAL | Status: DC

## 2017-05-25 MED ORDER — STERILE WATER FOR IRRIGATION IR SOLN
Status: DC | PRN
  Administered 2017-05-25: 15 mL

## 2017-05-25 MED ORDER — MEPERIDINE HCL 50 MG/ML IJ SOLN
INTRAMUSCULAR | Status: AC
  Filled 2017-05-25: qty 1

## 2017-05-25 MED ORDER — MIDAZOLAM HCL 5 MG/5ML IJ SOLN
INTRAMUSCULAR | Status: AC
  Filled 2017-05-25: qty 10

## 2017-05-25 MED ORDER — MEPERIDINE HCL 50 MG/ML IJ SOLN
INTRAMUSCULAR | Status: DC | PRN
  Administered 2017-05-25 (×2): 25 mg via INTRAVENOUS

## 2017-05-25 NOTE — H&P (Signed)
Shelia Pierce is an 77 y.o. female.   Chief Complaint: Patient is here for colonoscopy. HPI: Patient is 77 year old Caucasian female who presents with a few week history of change in her bowel habits.  She also has noted blood in the tissue.  She is to have one formed stool daily.  Now she is having she has had loose stools as well.  Tends to have bowel movement after meals.  Denies anorexia or weight loss or abdominal pain.  No history of recent antibiotic use. Last colonoscopy was in 2014.  She was found to have hyperplastic polyp.  She has had adenomas on prior colonoscopy.  She was therefore advised to return in 7 years which would have been 2 years from now. Family history is negative for CRC.  Past Medical History:  Diagnosis Date  . Colon polyps   . Diabetes mellitus without complication ([place])    Diet controlled  . Hypercholesteremia   . Hypertension     Past Surgical History:  Procedure Laterality Date  . ABDOMINAL HYSTERECTOMY    . COLONOSCOPY    . COLONOSCOPY N/A 11/21/2012   Procedure: COLONOSCOPY;  Surgeon: Rogene Houston, MD;  Location: [facility];  Service: Endoscopy;  Laterality: N/A;  1030  . Left breast biopsy    . Left foot      Family History  Problem Relation Age of Onset  . Colon cancer Neg Hx    Social History:  reports that she has never smoked. She has never used smokeless tobacco. She reports that she does not drink alcohol or use drugs.  Allergies: No Known Allergies  Medications Prior to Admission  Medication Sig Dispense Refill  . ARTIFICIAL TEAR OP Place 2 drops into both eyes daily as needed (for dry eyes).    Marland Kitchen aspirin EC 81 MG tablet Take 81 mg by mouth daily.    Marland Kitchen CALCIUM CARBONATE-VITAMIN D PO Take 1 tablet by mouth daily.     Marland Kitchen ibuprofen (ADVIL,MOTRIN) 200 MG tablet Take 400 mg by mouth every 6 (six) hours as needed for headache or moderate pain.     Marland Kitchen lisinopril (PRINIVIL,ZESTRIL) 10 MG tablet Take 10 mg by mouth daily.    . metFORMIN  (GLUCOPHAGE-XR) 500 MG 24 hr tablet Take 1,000 mg by mouth daily.    . Multiple Vitamin (MULTIVITAMIN WITH MINERALS) TABS tablet Take 1 tablet by mouth daily.    . Omega-3 Fatty Acids (FISH OIL PO) Take 1 capsule by mouth daily.    . Red Yeast Rice 600 MG CAPS Take 600 mg by mouth daily.    . simvastatin (ZOCOR) 10 MG tablet Take 10 mg by mouth daily.    . vitamin A 8000 UNIT capsule Take 8,000 Units by mouth daily.    . vitamin B-12 (CYANOCOBALAMIN) 500 MCG tablet Take 500 mcg by mouth daily.    . vitamin C (ASCORBIC ACID) 500 MG tablet Take 500 mg by mouth daily.      Results for orders placed or performed during the hospital encounter of 05/25/17 (from the past 48 hour(s))  Glucose, capillary     Status: Abnormal   Collection Time: 05/25/17  7:41 AM  Result Value Ref Range   Glucose-Capillary 121 (H) 65 - 99 mg/dL   No results found.  ROS  Blood pressure (!) 172/79, pulse 69, temperature 97.9 F (36.6 C), temperature source Oral, resp. rate 18, SpO2 99 %. Physical Exam  Constitutional: She appears well-developed and well-nourished.  HENT:  Mouth/Throat: Oropharynx is clear and moist.  Eyes: Conjunctivae are normal. No scleral icterus.  Neck: No thyromegaly present.  Cardiovascular: Normal rate, regular rhythm and normal heart sounds.  No murmur heard. Respiratory: Effort normal and breath sounds normal.  GI: Soft. She exhibits no distension and no mass. There is no tenderness.  Small umbilical hernia.  Musculoskeletal: She exhibits no edema.  Lymphadenopathy:    She has no cervical adenopathy.  Neurological: She is alert.  Skin: Skin is warm and dry.     Assessment/Plan Change in bowel habits or rectal bleeding. Diagnostic colonoscopy.  Hildred Laser, MD 05/25/2017, 8:44 AM

## 2017-05-25 NOTE — Discharge Instructions (Signed)
Resume aspirin on 05/26/2017. Resume other medications as before. Anusol HC Suppository 1 per rectum daily at bedtime for 2 weeks. Benefiber 4 g p.o. Nightly. Modified carb high-fiber diet. Physician will call with biopsy results.   Colonoscopy, Adult, Care After This sheet gives you information about how to care for yourself after your procedure. Your health care provider may also give you more specific instructions. If you have problems or questions, contact your health care provider.  Dr Laural Golden:  [phone].  After hours and weekends call the hospital and ask to have the GI doctor on call paged; they will call you back. What can I expect after the procedure? After the procedure, it is common to have:  A small amount of blood in your stool for 24 hours after the procedure.  Some gas.  Mild abdominal cramping or bloating.  Follow these instructions at home: General instructions   For the first 24 hours after the procedure: ? Do not drive or use machinery. ? Do not sign important documents. ? Do not drink alcohol. ? Rest often.  Take over-the-counter or prescription medicines only as told by your health care provider.  It is up to you to get the results of your procedure. Ask your health care provider, or the department performing the procedure, when your results will be ready. Relieving cramping and bloating  Try walking around when you have cramps or feel bloated. Eating and drinking  Drink enough fluid to keep your urine clear or pale yellow.  Resume your normal diet as instructed by your health care provider.  Contact a health care provider if:  You have blood in your stool 2-3 days after the procedure. Get help right away if:  You have more than a small spotting of blood in your stool.  You pass large blood clots in your stool.  Your abdomen is swollen.  You have nausea or vomiting.  You have a fever.  You have increasing abdominal pain that is not relieved  with medicine. This information is not intended to replace advice given to you by your health care provider. Make sure you discuss any questions you have with your health care provider. Document Released: 08/17/2003 Document Revised: 09/27/2015 Document Reviewed: 03/16/2015 Elsevier Interactive Patient Education  [employer].

## 2017-05-25 NOTE — Op Note (Signed)
[facility] Patient Name: Shelia Pierce Procedure Date: 05/25/2017 8:27 AM MRN: [id number] Date of Birth: 10/13/1940 Attending MD: Hildred Laser , MD CSN: [id number] Age: 77 Admit Type: Outpatient Procedure:                Colonoscopy Indications:              Hematochezia, Change in bowel habits Providers:                Hildred Laser, MD, Charlsie Quest. Theda Sers RN, RN, Nelma Rothman, Technician Referring MD:             Halford Chessman, MD Medicines:                Meperidine 50 mg IV, Midazolam 6 mg IV Complications:            No immediate complications. Estimated Blood Loss:     Estimated blood loss was minimal. Procedure:                Pre-Anesthesia Assessment:                           - Prior to the procedure, a History and Physical                            was performed, and patient medications and                            allergies were reviewed. The patient's tolerance of                            previous anesthesia was also reviewed. The risks                            and benefits of the procedure and the sedation                            options and risks were discussed with the patient.                            All questions were answered, and informed consent                            was obtained. Prior Anticoagulants: The patient                            last took aspirin 14 days prior to the procedure.                            ASA Grade Assessment: II - A patient with mild                            systemic disease. After reviewing the risks and  benefits, the patient was deemed in satisfactory                            condition to undergo the procedure.                           After obtaining informed consent, the colonoscope                            was passed under direct vision. Throughout the                            procedure, the patient's blood pressure, pulse, and            oxygen saturations were monitored continuously. The                            EC-[id number] ([id number]) scope was introduced through                            the anus and advanced to the the cecum, identified                            by appendiceal orifice and ileocecal valve. The                            colonoscopy was performed without difficulty. The                            patient tolerated the procedure well. The quality                            of the bowel preparation was good. The ileocecal                            valve, appendiceal orifice, and rectum were                            photographed. Scope In: 8:55:36 AM Scope Out: 9:13:16 AM Scope Withdrawal Time: 0 hours 8 minutes 51 seconds  Total Procedure Duration: 0 hours 17 minutes 40 seconds  Findings:      The perianal and digital rectal examinations were normal.      The splenic flexure, transverse colon, hepatic flexure, ascending colon,       cecum, appendiceal orifice and ileocecal valve appeared normal.      A few small-mouthed diverticula were found in the descending colon.      Multiple small and large-mouthed diverticula were found in the sigmoid       colon.      Normal mucosa was found in the sigmoid colon. Biopsies for histology       were taken with a cold forceps from the sigmoid colon for evaluation of       microscopic colitis.      Internal hemorrhoids were found during retroflexion with surface       erosion. The hemorrhoids were small. Impression:               -  The splenic flexure, transverse colon, hepatic                            flexure, ascending colon, cecum, appendiceal                            orifice and ileocecal valve are normal.                           - Diverticulosis in the descending colon.                           - Diverticulosis in the sigmoid colon.                           - Normal mucosa in the sigmoid colon. Biopsied.                           - Internal  hemorrhoids with surface erosion. Moderate Sedation:      Moderate (conscious) sedation was administered by the endoscopy nurse       and supervised by the endoscopist. The following parameters were       monitored: oxygen saturation, heart rate, blood pressure, CO2       capnography and response to care. Total physician intraservice time was       34 minutes. Recommendation:           - Patient has a contact number available for                            emergencies. The signs and symptoms of potential                            delayed complications were discussed with the                            patient. Return to normal activities tomorrow.                            Written discharge instructions were provided to the                            patient.                           - High fiber diet and diabetic (ADA) diet today.                           - Continue present medications.                           - No aspirin, ibuprofen, naproxen, or other                            non-steroidal anti-inflammatory drugs for 1 day.                           -  Await pathology results.                           - Benefiber 4 gm po qhs.                           - Anusol HC supp one per rectum qhs for 2 weeks.                           - No repeat colonoscopy due to age and the absence                            of advanced adenomas. Procedure Code(s):        --- Professional ---                           [phone], Colonoscopy, flexible; with biopsy, single                            or multiple                           G0500, Moderate sedation services provided by the                            same physician or other qualified health care                            professional performing a gastrointestinal                            endoscopic service that sedation supports,                            requiring the presence of an independent trained                            observer  to assist in the monitoring of the                            patient's level of consciousness and physiological                            status; initial 15 minutes of intra-service time;                            patient age 50 years or older (additional time may                            be reported with [phone], as appropriate)                           [phone], Moderate sedation services provided by the  same physician or other qualified health care                            professional performing the diagnostic or                            therapeutic service that the sedation supports,                            requiring the presence of an independent trained                            observer to assist in the monitoring of the                            patient's level of consciousness and physiological                            status; each additional 15 minutes intraservice                            time (List separately in addition to code for                            primary service) Diagnosis Code(s):        --- Professional ---                           K64.8, Other hemorrhoids                           K92.1, Melena (includes Hematochezia)                           R19.4, Change in bowel habit                           K57.30, Diverticulosis of large intestine without                            perforation or abscess without bleeding CPT copyright 2017 American Medical Association. All rights reserved. The codes documented in this report are preliminary and upon coder review may  be revised to meet current compliance requirements. Hildred Laser, MD Hildred Laser, MD 05/25/2017 9:22:42 AM This report has been signed electronically. Number of Addenda: 0

## 2017-05-30 ENCOUNTER — Encounter (HOSPITAL_COMMUNITY): Payer: Self-pay | Admitting: Internal Medicine

## 2017-08-24 ENCOUNTER — Other Ambulatory Visit: Payer: Self-pay

## 2017-08-24 NOTE — Patient Outreach (Signed)
[facility]) Care Management  08/24/2017  [name] 01-Sep-1940 [id number]   Medication Adherence call to Shelia Pierce left a message for patient to call back patient is due on Metfrmin Er 500 mg and Lisopril 10 mg. Shelia Pierce is showing past due under [place].  [place] Management Direct Dial [phone]  Fax [phone] [email or web address]

## 2017-09-21 DIAGNOSIS — Z1389 Encounter for screening for other disorder: Secondary | ICD-10-CM | POA: Diagnosis not present

## 2017-09-21 DIAGNOSIS — M2012 Hallux valgus (acquired), left foot: Secondary | ICD-10-CM | POA: Diagnosis not present

## 2017-09-21 DIAGNOSIS — I10 Essential (primary) hypertension: Secondary | ICD-10-CM | POA: Diagnosis not present

## 2017-09-21 DIAGNOSIS — E119 Type 2 diabetes mellitus without complications: Secondary | ICD-10-CM | POA: Diagnosis not present

## 2017-09-21 DIAGNOSIS — E782 Mixed hyperlipidemia: Secondary | ICD-10-CM | POA: Diagnosis not present

## 2017-09-21 DIAGNOSIS — M810 Age-related osteoporosis without current pathological fracture: Secondary | ICD-10-CM | POA: Diagnosis not present

## 2017-10-29 ENCOUNTER — Other Ambulatory Visit (HOSPITAL_COMMUNITY): Payer: Self-pay | Admitting: Internal Medicine

## 2017-10-29 DIAGNOSIS — Z1231 Encounter for screening mammogram for malignant neoplasm of breast: Secondary | ICD-10-CM

## 2017-12-03 ENCOUNTER — Other Ambulatory Visit: Payer: Self-pay

## 2017-12-03 ENCOUNTER — Ambulatory Visit (HOSPITAL_COMMUNITY)
Admission: RE | Admit: 2017-12-03 | Discharge: 2017-12-03 | Disposition: A | Discharge status: Home / Self Care | Payer: Medicare Other | Source: Ambulatory Visit | Attending: Internal Medicine | Admitting: Internal Medicine

## 2017-12-03 DIAGNOSIS — Z1231 Encounter for screening mammogram for malignant neoplasm of breast: Secondary | ICD-10-CM

## 2017-12-03 NOTE — Patient Outreach (Signed)
[facility]) Care Management  12/03/2017  MARIZA BOURGET 1940/04/26 [id number]   Medication Adherence call to Mrs. Jeimy Bickert left a message with patient's husband for patient to call back patient is due on  Lisinopril 10 mg. Mrs. Stang is showing past due under [place].   [place] Management Direct Dial [phone]  Fax [phone] [email or web address]

## 2017-12-27 ENCOUNTER — Other Ambulatory Visit: Payer: Self-pay

## 2017-12-27 NOTE — Patient Outreach (Signed)
[facility]) Care Management  12/27/2017  Shelia Pierce March 01, 1940 [id number]   Medication Adherence call to Shelia Pierce spoke with patient she was very hesitance on providing her information, patient  hung up the phone. patient is due on Metformin ER 500 mg. Shelia Pierce is showing past due under [place].    [facility] Management Direct Dial [phone]  Fax [phone] [email or web address]

## 2018-01-08 ENCOUNTER — Emergency Department (HOSPITAL_COMMUNITY)
Admission: EM | Admit: 2018-01-08 | Discharge: 2018-01-08 | Disposition: A | Discharge status: Home / Self Care | Payer: Medicare Other | Attending: Emergency Medicine | Admitting: Emergency Medicine

## 2018-01-08 ENCOUNTER — Encounter (HOSPITAL_COMMUNITY): Payer: Self-pay | Admitting: Emergency Medicine

## 2018-01-08 ENCOUNTER — Other Ambulatory Visit: Payer: Self-pay

## 2018-01-08 DIAGNOSIS — R531 Weakness: Secondary | ICD-10-CM | POA: Diagnosis present

## 2018-01-08 DIAGNOSIS — N3001 Acute cystitis with hematuria: Secondary | ICD-10-CM | POA: Insufficient documentation

## 2018-01-08 DIAGNOSIS — Z79899 Other long term (current) drug therapy: Secondary | ICD-10-CM | POA: Insufficient documentation

## 2018-01-08 DIAGNOSIS — R5383 Other fatigue: Secondary | ICD-10-CM | POA: Diagnosis not present

## 2018-01-08 DIAGNOSIS — I10 Essential (primary) hypertension: Secondary | ICD-10-CM | POA: Diagnosis not present

## 2018-01-08 DIAGNOSIS — Z7984 Long term (current) use of oral hypoglycemic drugs: Secondary | ICD-10-CM | POA: Insufficient documentation

## 2018-01-08 DIAGNOSIS — E119 Type 2 diabetes mellitus without complications: Secondary | ICD-10-CM | POA: Diagnosis not present

## 2018-01-08 LAB — BASIC METABOLIC PANEL
ANION GAP: 9 (ref 5–15)
BUN: 14 mg/dL (ref 8–23)
CALCIUM: 8.4 mg/dL — AB (ref 8.9–10.3)
CHLORIDE: 104 mmol/L (ref 98–111)
CO2: 24 mmol/L (ref 22–32)
CREATININE: 0.63 mg/dL (ref 0.44–1.00)
GFR calc non Af Amer: >60 mL/min (ref >60)
Glucose, Bld: 144 mg/dL — ABNORMAL HIGH (ref 70–99)
Potassium: 3.5 mmol/L (ref 3.5–5.1)
SODIUM: 137 mmol/L (ref 135–145)

## 2018-01-08 LAB — CBC WITH DIFFERENTIAL/PLATELET
Abs Immature Granulocytes: 0.04 10*3/uL (ref 0.00–0.07)
BASOS ABS: 0.1 10*3/uL (ref 0.0–0.1)
Basophils Relative: 0 %
EOS PCT: 0 %
Eosinophils Absolute: 0 10*3/uL (ref 0.0–0.5)
HEMATOCRIT: 41.9 % (ref 36.0–46.0)
HEMOGLOBIN: 13.4 g/dL (ref 12.0–15.0)
IMMATURE GRANULOCYTES: 0 %
LYMPHS ABS: 0.7 10*3/uL (ref 0.7–4.0)
LYMPHS PCT: 6 %
MCH: 28.2 pg (ref 26.0–34.0)
MCHC: 32 g/dL (ref 30.0–36.0)
MCV: 88 fL (ref 80.0–100.0)
Monocytes Absolute: 0.7 10*3/uL (ref 0.1–1.0)
Monocytes Relative: 6 %
NEUTROS ABS: 10.2 10*3/uL — AB (ref 1.7–7.7)
NEUTROS PCT: 88 %
NRBC: 0 % (ref 0.0–0.2)
Platelets: 255 10*3/uL (ref 150–400)
RBC: 4.76 MIL/uL (ref 3.87–5.11)
RDW: 13.3 % (ref 11.5–15.5)
WBC: 11.6 10*3/uL — AB (ref 4.0–10.5)

## 2018-01-08 LAB — CBG MONITORING, ED: Glucose-Capillary: 144 mg/dL — ABNORMAL HIGH (ref 70–99)

## 2018-01-08 LAB — URINALYSIS, ROUTINE W REFLEX MICROSCOPIC
BILIRUBIN URINE: NEGATIVE
GLUCOSE, UA: 50 mg/dL — AB
HGB URINE DIPSTICK: NEGATIVE
Ketones, ur: 20 mg/dL — AB
NITRITE: NEGATIVE
Protein, ur: 100 mg/dL — AB
SPECIFIC GRAVITY, URINE: 1.012 (ref 1.005–1.030)
WBC, UA: >50 WBC/hpf — ABNORMAL HIGH (ref 0–5)
pH: 5 (ref 5.0–8.0)

## 2018-01-08 MED ORDER — HYDROCORTISONE 2.5 % RE CREA: TOPICAL_CREAM | RECTAL | 0 refills | Status: DC

## 2018-01-08 MED ORDER — SODIUM CHLORIDE 0.9 % IV BOLUS
500.0000 mL | Freq: Once | INTRAVENOUS | Status: AC
  Administered 2018-01-08: 500 mL via INTRAVENOUS

## 2018-01-08 MED ORDER — ONDANSETRON 4 MG PO TBDP: ORAL_TABLET | ORAL | 0 refills | Status: DC

## 2018-01-08 MED ORDER — SODIUM CHLORIDE 0.9 % IV SOLN
1.0000 g | Freq: Once | INTRAVENOUS | Status: AC
  Administered 2018-01-08: 1 g via INTRAVENOUS
  Filled 2018-01-08: qty 10

## 2018-01-08 MED ORDER — ONDANSETRON 4 MG PO TBDP
4.0000 mg | ORAL_TABLET | Freq: Once | ORAL | Status: AC
  Administered 2018-01-08: 4 mg via ORAL
  Filled 2018-01-08: qty 1

## 2018-01-08 MED ORDER — CEPHALEXIN 500 MG PO CAPS: 500.0000 mg | ORAL_CAPSULE | Freq: Four times a day (QID) | ORAL | 0 refills | Status: DC

## 2018-01-08 NOTE — Discharge Instructions (Addendum)
Follow-up with your family doctor next week to check your hemorrhoid and your urinary tract infection.  Return to the emergency department if any problems.  Follow-up with Dr. Aviva Signs to fix your abdominal hernia.  And follow-up with Dr.Eure about tacking your bladder

## 2018-01-08 NOTE — ED Triage Notes (Signed)
Pt c/o of n/v, generalized body aches and chills since this morning.  Pt also c/o of her hernia that is bleeding causing pain

## 2018-01-08 NOTE — ED Notes (Signed)
MD at bedside. 

## 2018-01-08 NOTE — ED Provider Notes (Signed)
[facility] EMERGENCY DEPARTMENT Provider Note   CSN: [id number] Arrival date & time: 01/08/18  1427     History   Chief Complaint Chief Complaint  Patient presents with  . Generalized Body Aches    HPI Shelia Pierce is a 77 y.o. female.  Patient complains of fevers and aches dysuria  The history is provided by the patient. No language interpreter was used.  Weakness  Primary symptoms include no focal weakness. This is a new problem. The current episode started 12 to 24 hours ago. The problem has not changed since onset.There was no focality noted. The maximum temperature recorded prior to her arrival was 100 to 100.9 F. Pertinent negatives include no shortness of breath, no chest pain and no headaches.    Past Medical History:  Diagnosis Date  . Colon polyps   . Diabetes mellitus without complication ([place])    Diet controlled  . Hypercholesteremia   . Hypertension     Patient Active Problem List   Diagnosis Date Noted  . Rectal bleeding 04/26/2017    Past Surgical History:  Procedure Laterality Date  . ABDOMINAL HYSTERECTOMY    . COLONOSCOPY    . COLONOSCOPY N/A 11/21/2012   Procedure: COLONOSCOPY;  Surgeon: Rogene Houston, MD;  [facility];  Service: Endoscopy;  Laterality: N/A;  1030  . COLONOSCOPY N/A 05/25/2017   Procedure: COLONOSCOPY;  Surgeon: Rogene Houston, MD;  Location: [facility];  Service: Endoscopy;  Laterality: N/A;  830  . Left breast biopsy    . Left foot       OB History   No obstetric history on file.      Home Medications    Prior to Admission medications   Medication Sig Start Date End Date Taking? Authorizing Provider  alendronate (FOSAMAX) 70 MG tablet Take 70 mg by mouth every Wednesday. Take with a full glass of water on an empty stomach.   Yes [provider]  ARTIFICIAL TEAR OP Place 2 drops into both eyes daily as needed (for dry eyes).   Yes [provider]  CALCIUM CARBONATE-VITAMIN D PO  Take 1 tablet by mouth daily.     [provider]  cephALEXin (KEFLEX) 500 MG capsule Take 1 capsule (500 mg total) by mouth 4 (four) times daily. 01/08/18   Milton Ferguson, MD  hydrocortisone (ANUSOL-HC) 2.5 % rectal cream Apply rectally 4 times daily for 7-10 days 01/08/18   Milton Ferguson, MD  hydrocortisone (ANUSOL-HC) 25 MG suppository Place 1 suppository (25 mg total) rectally at bedtime. 05/25/17   Rehman, Mechele Dawley, MD  ibuprofen (ADVIL,MOTRIN) 200 MG tablet Take 400 mg by mouth every 6 (six) hours as needed for headache or moderate pain.     [provider]  lisinopril (PRINIVIL,ZESTRIL) 10 MG tablet Take 10 mg by mouth daily.    [provider]  metFORMIN (GLUCOPHAGE-XR) 500 MG 24 hr tablet Take 1,000 mg by mouth daily. 04/19/17   [provider]  Multiple Vitamin (MULTIVITAMIN WITH MINERALS) TABS tablet Take 1 tablet by mouth daily.    [provider]  Omega-3 Fatty Acids (FISH OIL PO) Take 1 capsule by mouth daily.    [provider]  ondansetron (ZOFRAN ODT) 4 MG disintegrating tablet 4mg  ODT q4 hours prn nausea/vomit 01/08/18   Milton Ferguson, MD  Red Yeast Rice 600 MG CAPS Take 600 mg by mouth daily.    [provider]  vitamin A 8000 UNIT capsule Take 8,000  Units by mouth daily.    [provider]  vitamin B-12 (CYANOCOBALAMIN) 500 MCG tablet Take 500 mcg by mouth daily.    [provider]  vitamin C (ASCORBIC ACID) 500 MG tablet Take 500 mg by mouth daily.    [provider]  Wheat Dextrin (BENEFIBER DRINK MIX) PACK Take 4 g by mouth at bedtime. 05/25/17   Rogene Houston, MD    Family History Family History  Problem Relation Age of Onset  . Colon cancer Neg Hx     Social History Social History   Tobacco Use  . Smoking status: Never Smoker  . Smokeless tobacco: Never Used  Substance Use Topics  . Alcohol use: No  . Drug use: No     Allergies   Patient has no known  allergies.   Review of Systems Review of Systems  Constitutional: Positive for fatigue. Negative for appetite change.  HENT: Negative for congestion, ear discharge and sinus pressure.   Eyes: Negative for discharge.  Respiratory: Negative for cough and shortness of breath.   Cardiovascular: Negative for chest pain.  Gastrointestinal: Negative for abdominal pain and diarrhea.  Genitourinary: Negative for frequency and hematuria.  Musculoskeletal: Negative for back pain.  Skin: Negative for rash.  Neurological: Positive for weakness. Negative for focal weakness, seizures and headaches.  Psychiatric/Behavioral: Negative for hallucinations.     Physical Exam Updated Vital Signs BP 131/72 (BP Location: Right Arm)   Pulse 86   Temp 99.4 F (37.4 C) (Oral)   Resp 12   Ht 5' (1.524 m)   Wt 75.8 kg   SpO2 94%   BMI 32.61 kg/m   Physical Exam Vitals signs reviewed.  Constitutional:      Appearance: She is well-developed.  HENT:     Head: Normocephalic.     Nose: Nose normal.  Eyes:     General: No scleral icterus.    Conjunctiva/sclera: Conjunctivae normal.  Neck:     Musculoskeletal: Neck supple.     Thyroid: No thyromegaly.  Cardiovascular:     Rate and Rhythm: Normal rate and regular rhythm.     Heart sounds: No murmur. No friction rub. No gallop.   Pulmonary:     Breath sounds: No stridor. No wheezing or rales.  Chest:     Chest wall: No tenderness.  Abdominal:     General: There is no distension.     Tenderness: There is no abdominal tenderness. There is no rebound.     Comments: Small umbilical hernia  Genitourinary:    Comments: 1 large hemorrhoid Musculoskeletal: Normal range of motion.  Lymphadenopathy:     Cervical: No cervical adenopathy.  Skin:    Findings: No erythema or rash.  Neurological:     Mental Status: She is alert and oriented to person, place, and time.     Motor: No abnormal muscle tone.     Coordination: Coordination normal.   Psychiatric:        Behavior: Behavior normal.      ED Treatments / Results  Labs (all labs ordered are listed, but only abnormal results are displayed) Labs Reviewed  CBC WITH DIFFERENTIAL/PLATELET - Abnormal; Notable for the following components:      Result Value   WBC 11.6 (*)    Neutro Abs 10.2 (*)    All other components within normal limits  BASIC METABOLIC PANEL - Abnormal; Notable for the following components:   Glucose, Bld 144 (*)    Calcium 8.4 (*)  All other components within normal limits  URINALYSIS, ROUTINE W REFLEX MICROSCOPIC - Abnormal; Notable for the following components:   APPearance CLOUDY (*)    Glucose, UA 50 (*)    Ketones, ur 20 (*)    Protein, ur 100 (*)    Leukocytes, UA LARGE (*)    WBC, UA >50 (*)    Bacteria, UA RARE (*)    All other components within normal limits  CBG MONITORING, ED - Abnormal; Notable for the following components:   Glucose-Capillary 144 (*)    All other components within normal limits  URINE CULTURE    EKG None  Radiology No results found.  Procedures Procedures (including critical care time)  Medications Ordered in ED Medications  ondansetron (ZOFRAN-ODT) disintegrating tablet 4 mg (4 mg Oral Given 01/08/18 1452)  sodium chloride 0.9 % bolus 500 mL (0 mLs Intravenous Stopped 01/08/18 1820)  cefTRIAXone (ROCEPHIN) 1 g in sodium chloride 0.9 % 100 mL IVPB (0 g Intravenous Stopped 01/08/18 1820)     Initial Impression / Assessment and Plan / ED Course  I have reviewed the triage vital signs and the nursing notes.  Pertinent labs & imaging results that were available during my care of the patient were reviewed by me and considered in my medical decision making (see chart for details).     With urinary tract infection.  She is nontoxic so she will be treated at home with Keflex.  She also has a hemorrhoid that she is getting Anusol HC cream for  Final Clinical Impressions(s) / ED Diagnoses   Final  diagnoses:  Acute cystitis with hematuria    ED Discharge Orders         Ordered    cephALEXin (KEFLEX) 500 MG capsule  4 times daily     01/08/18 1906    ondansetron (ZOFRAN ODT) 4 MG disintegrating tablet     01/08/18 1906    hydrocortisone (ANUSOL-HC) 2.5 % rectal cream     01/08/18 1906           Milton Ferguson, MD 01/08/18 1910

## 2018-01-11 ENCOUNTER — Telehealth (HOSPITAL_COMMUNITY): Payer: Self-pay

## 2018-01-12 LAB — URINE CULTURE: Culture: >=100000 — AB

## 2018-01-13 ENCOUNTER — Telehealth: Payer: Self-pay

## 2018-01-13 NOTE — Telephone Encounter (Signed)
Post ED Visit - Positive Culture Follow-up  Culture report reviewed by antimicrobial stewardship pharmacist:  []  Elenor Quinones, Pharm.D. []  Heide Guile, Pharm.D., BCPS AQ-ID [x]  Parks Neptune, Pharm.D., BCPS []  Alycia Rossetti, Pharm.D., BCPS []  [place], Pharm.D., BCPS, AAHIVP []  Legrand Como, Pharm.D., BCPS, AAHIVP []  Salome Arnt, PharmD, BCPS []  Johnnette Gourd, PharmD, BCPS []  Hughes Better, PharmD, BCPS []  Leeroy Cha, PharmD  Positive urine culture Treated with Cephalexin, organism sensitive to the same and no further patient follow-up is required at this time.  Genia Del 01/13/2018, 10:08 AM

## 2018-01-18 ENCOUNTER — Encounter: Payer: Self-pay | Admitting: Obstetrics & Gynecology

## 2018-01-18 DIAGNOSIS — E119 Type 2 diabetes mellitus without complications: Secondary | ICD-10-CM | POA: Diagnosis not present

## 2018-01-18 DIAGNOSIS — N342 Other urethritis: Secondary | ICD-10-CM | POA: Diagnosis not present

## 2018-01-18 DIAGNOSIS — E782 Mixed hyperlipidemia: Secondary | ICD-10-CM | POA: Diagnosis not present

## 2018-01-18 DIAGNOSIS — I10 Essential (primary) hypertension: Secondary | ICD-10-CM | POA: Diagnosis not present

## 2018-01-18 DIAGNOSIS — Z0001 Encounter for general adult medical examination with abnormal findings: Secondary | ICD-10-CM | POA: Diagnosis not present

## 2018-01-18 DIAGNOSIS — K648 Other hemorrhoids: Secondary | ICD-10-CM | POA: Diagnosis not present

## 2018-01-18 DIAGNOSIS — M810 Age-related osteoporosis without current pathological fracture: Secondary | ICD-10-CM | POA: Diagnosis not present

## 2018-01-18 DIAGNOSIS — Z1389 Encounter for screening for other disorder: Secondary | ICD-10-CM | POA: Diagnosis not present

## 2018-01-31 DIAGNOSIS — E860 Dehydration: Secondary | ICD-10-CM | POA: Diagnosis not present

## 2018-01-31 DIAGNOSIS — N10 Acute tubulo-interstitial nephritis: Secondary | ICD-10-CM | POA: Diagnosis not present

## 2018-01-31 DIAGNOSIS — E119 Type 2 diabetes mellitus without complications: Secondary | ICD-10-CM | POA: Diagnosis not present

## 2018-02-11 ENCOUNTER — Encounter: Payer: Self-pay | Admitting: Obstetrics & Gynecology

## 2018-02-11 ENCOUNTER — Ambulatory Visit: Payer: Medicare Other | Admitting: Obstetrics & Gynecology

## 2018-02-11 VITALS — BP 198/101 | HR 67 | Ht 60.0 in | Wt 158.6 lb

## 2018-02-11 DIAGNOSIS — N993 Prolapse of vaginal vault after hysterectomy: Secondary | ICD-10-CM

## 2018-02-11 NOTE — Progress Notes (Signed)
Chief Complaint  Patient presents with  . bladder down      78 y.o. No obstetric history on file. No LMP recorded. Patient has had a hysterectomy. The current method of family planning is status post hysterectomy.  Outpatient Encounter Medications as of 02/11/2018  Medication Sig  . alendronate (FOSAMAX) 70 MG tablet Take 70 mg by mouth every Wednesday. Take with a full glass of water on an empty stomach.  . ARTIFICIAL TEAR OP Place 2 drops into both eyes daily as needed (for dry eyes).  . CALCIUM CARBONATE-VITAMIN D PO Take 2 tablets by mouth daily.   . hydrocortisone (ANUSOL-HC) 25 MG suppository Place 1 suppository (25 mg total) rectally at bedtime.  Marland Kitchen lisinopril (PRINIVIL,ZESTRIL) 10 MG tablet Take 10 mg by mouth daily.  . metFORMIN (GLUCOPHAGE-XR) 500 MG 24 hr tablet Take 500 mg by mouth daily.   Marland Kitchen OVER THE COUNTER MEDICATION [employer] vitamin  . cephALEXin (KEFLEX) 500 MG capsule Take 1 capsule (500 mg total) by mouth 4 (four) times daily. (Patient not taking: Reported on 02/11/2018)  . hydrocortisone (ANUSOL-HC) 2.5 % rectal cream Apply rectally 4 times daily for 7-10 days (Patient not taking: Reported on 02/11/2018)  . ondansetron (ZOFRAN ODT) 4 MG disintegrating tablet 4mg  ODT q4 hours prn nausea/vomit (Patient not taking: Reported on 02/11/2018)   No facility-administered encounter medications on file as of 02/11/2018.     Subjective Pt with recurrent UTI Here because bladder has fallen Several years no pain + pressure Past Medical History:  Diagnosis Date  . Colon polyps   . Diabetes mellitus without complication ([place])    Diet controlled  . Hypercholesteremia   . Hypertension     Past Surgical History:  Procedure Laterality Date  . ABDOMINAL HYSTERECTOMY    . COLONOSCOPY    . COLONOSCOPY N/A 11/21/2012   Procedure: COLONOSCOPY;  Surgeon: Rogene Houston, MD;  [facility];  Service: Endoscopy;  Laterality: N/A;  1030  . COLONOSCOPY N/A 05/25/2017   Procedure: COLONOSCOPY;  Surgeon: Rogene Houston, MD;  [facility];  Service: Endoscopy;  Laterality: N/A;  830  . Left breast biopsy    . Left foot      OB History   No obstetric history on file.     No Known Allergies  Social History   Socioeconomic History  . Marital status: Married    Spouse name: Not on file  . Number of children: Not on file  . Years of education: Not on file  . Highest education level: Not on file  Occupational History  . Not on file  Social Needs  . Financial resource strain: Not on file  . Food insecurity:    Worry: Not on file    Inability: Not on file  . Transportation needs:    Medical: Not on file    Non-medical: Not on file  Tobacco Use  . Smoking status: Never Smoker  . Smokeless tobacco: Never Used  Substance and Sexual Activity  . Alcohol use: No  . Drug use: No  . Sexual activity: Not on file  Lifestyle  . Physical activity:    Days per week: Not on file    Minutes per session: Not on file  . Stress: Not on file  Relationships  . Social connections:    Talks on phone: Not on file    Gets together: Not on file    Attends religious service: Not on file  Active member of club or organization: Not on file    Attends meetings of clubs or organizations: Not on file    Relationship status: Not on file  Other Topics Concern  . Not on file  Social History Narrative  . Not on file    Family History  Problem Relation Age of Onset  . Other Mother        bladder infection  . Colon cancer Neg Hx     Medications:       Current Outpatient Medications:  .  alendronate (FOSAMAX) 70 MG tablet, Take 70 mg by mouth every Wednesday. Take with a full glass of water on an empty stomach., Disp: , Rfl:  .  ARTIFICIAL TEAR OP, Place 2 drops into both eyes daily as needed (for dry eyes)., Disp: , Rfl:  .  CALCIUM CARBONATE-VITAMIN D PO, Take 2 tablets by mouth daily. , Disp: , Rfl:  .  hydrocortisone (ANUSOL-HC) 25 MG  suppository, Place 1 suppository (25 mg total) rectally at bedtime., Disp: 14 suppository, Rfl: 1 .  lisinopril (PRINIVIL,ZESTRIL) 10 MG tablet, Take 10 mg by mouth daily., Disp: , Rfl:  .  metFORMIN (GLUCOPHAGE-XR) 500 MG 24 hr tablet, Take 500 mg by mouth daily. , Disp: , Rfl:  .  OVER THE COUNTER MEDICATION, [employer] vitamin, Disp: , Rfl:  .  cephALEXin (KEFLEX) 500 MG capsule, Take 1 capsule (500 mg total) by mouth 4 (four) times daily. (Patient not taking: Reported on 02/11/2018), Disp: 28 capsule, Rfl: 0 .  hydrocortisone (ANUSOL-HC) 2.5 % rectal cream, Apply rectally 4 times daily for 7-10 days (Patient not taking: Reported on 02/11/2018), Disp: 28.35 g, Rfl: 0 .  ondansetron (ZOFRAN ODT) 4 MG disintegrating tablet, 4mg  ODT q4 hours prn nausea/vomit (Patient not taking: Reported on 02/11/2018), Disp: 12 tablet, Rfl: 0  Objective Blood pressure (!) 198/101, pulse 67, height 5' (1.524 m), weight 158 lb 9.6 oz (71.9 kg).  General WDWN female NAD Vulva:  normal appearing vulva with no masses, tenderness or lesions Vagina:  Complete vaginal vault prolapse Cervix:  absent Uterus:  uterus absent Adnexa: ovaries:,     Pertinent ROS No burning with urination, frequency or urgency No nausea, vomiting or diarrhea Nor fever chills or other constitutional symptoms   Labs or studies Urine culture reviewed    Impression Diagnoses this Encounter::   ICD-10-CM   1. 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   Labs or Scans Ordered: No orders of the defined types were placed in this encounter.   Management:: >pessary Milex ring with support #4 placed may not be perfect fit, if comes out will switchto a #5  Follow up Return in about 1 month (around 03/14/2018) for Follow up, with Dr Elonda Husky.     All questions were answered.

## 2018-02-15 ENCOUNTER — Encounter: Payer: Medicare Other | Admitting: Obstetrics & Gynecology

## 2018-02-19 ENCOUNTER — Ambulatory Visit (INDEPENDENT_AMBULATORY_CARE_PROVIDER_SITE_OTHER): Payer: Medicare Other | Admitting: Obstetrics & Gynecology

## 2018-02-19 ENCOUNTER — Other Ambulatory Visit: Payer: Self-pay

## 2018-02-19 ENCOUNTER — Encounter: Payer: Self-pay | Admitting: Obstetrics & Gynecology

## 2018-02-19 VITALS — BP 186/89 | HR 75 | Ht 60.0 in | Wt 160.0 lb

## 2018-02-19 DIAGNOSIS — N993 Prolapse of vaginal vault after hysterectomy: Secondary | ICD-10-CM | POA: Diagnosis not present

## 2018-02-19 NOTE — Progress Notes (Signed)
Chief Complaint  Patient presents with  . pessary insertion    Blood pressure (!) 186/89, pulse 75, height 5' (1.524 m), weight 160 lb (72.6 kg).  Shelia Pierce presents today for routine follow up related to her pessary.   She uses a Milex ring with support Placed for the first time week before last but it came out after she was lifting a basket of clothes I knew it was not a perfect fit so she is back in for the larger size #5 She reports no vaginal discharge or vaginal bleeding.  Exam reveals no undue vaginal mucosal pressure of breakdown, no discharge and no vaginal bleeding.  The pessary is already out so the Milex ring with support #5 is plced today She has complete vaginal vault prolapse    Shelia Pierce will be sen back in 1 months for continued follow up.  Florian Buff, MD  02/19/2018 2:05 PM

## 2018-03-14 ENCOUNTER — Ambulatory Visit: Payer: Medicare Other | Admitting: Obstetrics & Gynecology

## 2018-03-14 ENCOUNTER — Encounter: Payer: Self-pay | Admitting: Obstetrics & Gynecology

## 2018-03-14 ENCOUNTER — Other Ambulatory Visit: Payer: Self-pay

## 2018-03-14 VITALS — BP 162/93 | HR 65 | Ht 60.0 in | Wt 162.0 lb

## 2018-03-14 DIAGNOSIS — N993 Prolapse of vaginal vault after hysterectomy: Secondary | ICD-10-CM

## 2018-03-14 DIAGNOSIS — Z4689 Encounter for fitting and adjustment of other specified devices: Secondary | ICD-10-CM

## 2018-03-15 ENCOUNTER — Encounter: Payer: Self-pay | Admitting: Obstetrics & Gynecology

## 2018-03-15 NOTE — Progress Notes (Signed)
Chief Complaint  Patient presents with  . pessary maintenance    Blood pressure (!) 162/93, pulse 65, height 5' (1.524 m), weight 162 lb (73.5 kg).  Shelia Pierce presents today for routine follow up related to her pessary.   She uses a Milex ring with support #5 She reports no vaginal discharge or vaginal bleeding.  Exam reveals no undue vaginal mucosal pressure of breakdown, no discharge and no vaginal bleeding.  The pessary is removed, cleaned and replaced without difficulty.    Shelia Pierce will be sen back in 3 months for continued follow up.  Florian Buff, MD  03/15/2018 6:54 AM

## 2018-06-06 ENCOUNTER — Other Ambulatory Visit: Payer: Self-pay

## 2018-06-06 NOTE — Patient Outreach (Signed)
[facility]) Care Management  06/06/2018  Shelia Pierce 1940/04/22 [id number]   Medication Adherence call to mrs. Shelia Pierce patient did not answer patient is past due on Simvastatin 10 mg under [place].  [place] Management Direct Dial [phone]  Fax [phone] [email or web address]

## 2018-06-12 ENCOUNTER — Telehealth: Payer: Self-pay | Admitting: *Deleted

## 2018-06-12 NOTE — Telephone Encounter (Signed)
Patient informed that we are not allowing visitors or children to come to appointments at this time. Patient advised to wear a mask to her appointment if she has one. Patient denies any contact with anyone suspected or confirmed of having COVID-19. Pt denies fever, cough, sob, muscle pain, diarrhea, rash, vomiting, abdominal pain, red eye, weakness, bruising or bleeding, joint pain or severe headache.    

## 2018-06-13 ENCOUNTER — Other Ambulatory Visit: Payer: Self-pay

## 2018-06-13 ENCOUNTER — Ambulatory Visit: Payer: Medicare Other | Admitting: Obstetrics & Gynecology

## 2018-06-13 ENCOUNTER — Encounter: Payer: Self-pay | Admitting: Obstetrics & Gynecology

## 2018-06-13 VITALS — BP 172/90 | HR 84 | Ht 60.0 in | Wt 160.0 lb

## 2018-06-13 DIAGNOSIS — Z4689 Encounter for fitting and adjustment of other specified devices: Secondary | ICD-10-CM | POA: Diagnosis not present

## 2018-06-13 DIAGNOSIS — N993 Prolapse of vaginal vault after hysterectomy: Secondary | ICD-10-CM

## 2018-06-13 NOTE — Progress Notes (Signed)
Chief Complaint  Patient presents with  . Pessary Check    Blood pressure (!) 172/90, pulse 84, height 5' (1.524 m), weight 160 lb (72.6 kg).  Shelia Pierce presents today for routine follow up related to her pessary.   She uses a milex ring with support #5 She reports no vaginal discharge or vaginal bleeding.  Exam reveals no undue vaginal mucosal pressure of breakdown, no discharge and no vaginal bleeding.  The pessary is removed, cleaned and replaced without difficulty.    [name] will be sen back in 4 months for continued follow up.  Florian Buff, MD  06/13/2018 9:59 AM

## 2018-06-27 ENCOUNTER — Other Ambulatory Visit: Payer: Self-pay

## 2018-06-27 NOTE — Patient Outreach (Signed)
[facility]) Care Management  06/27/2018  Shelia Pierce 20-Jan-1940 [id number]   Medication Adherence call to Shelia Pierce patient did not answer patient is due on Simvastatin 10 mg under [place].   [place] Management Direct Dial [phone]  Fax [phone] [email or web address]

## 2018-07-16 ENCOUNTER — Other Ambulatory Visit (HOSPITAL_COMMUNITY): Payer: Self-pay | Admitting: Physician Assistant

## 2018-07-16 ENCOUNTER — Ambulatory Visit (HOSPITAL_COMMUNITY)
Admission: RE | Admit: 2018-07-16 | Discharge: 2018-07-16 | Disposition: A | Discharge status: Home / Self Care | Payer: Medicare Other | Source: Ambulatory Visit | Attending: Physician Assistant | Admitting: Physician Assistant

## 2018-07-16 ENCOUNTER — Other Ambulatory Visit: Payer: Self-pay

## 2018-07-16 DIAGNOSIS — I10 Essential (primary) hypertension: Secondary | ICD-10-CM | POA: Diagnosis not present

## 2018-07-16 DIAGNOSIS — M1712 Unilateral primary osteoarthritis, left knee: Secondary | ICD-10-CM | POA: Diagnosis not present

## 2018-07-16 DIAGNOSIS — M25562 Pain in left knee: Secondary | ICD-10-CM | POA: Insufficient documentation

## 2018-07-16 DIAGNOSIS — Z1389 Encounter for screening for other disorder: Secondary | ICD-10-CM | POA: Diagnosis not present

## 2018-07-16 DIAGNOSIS — E119 Type 2 diabetes mellitus without complications: Secondary | ICD-10-CM | POA: Diagnosis not present

## 2018-07-17 DIAGNOSIS — M25562 Pain in left knee: Secondary | ICD-10-CM | POA: Diagnosis not present

## 2018-07-17 DIAGNOSIS — M1712 Unilateral primary osteoarthritis, left knee: Secondary | ICD-10-CM | POA: Diagnosis not present

## 2018-08-06 DIAGNOSIS — E119 Type 2 diabetes mellitus without complications: Secondary | ICD-10-CM | POA: Diagnosis not present

## 2018-09-12 DIAGNOSIS — H02834 Dermatochalasis of left upper eyelid: Secondary | ICD-10-CM | POA: Diagnosis not present

## 2018-09-12 DIAGNOSIS — H25812 Combined forms of age-related cataract, left eye: Secondary | ICD-10-CM | POA: Diagnosis not present

## 2018-09-12 DIAGNOSIS — H25813 Combined forms of age-related cataract, bilateral: Secondary | ICD-10-CM | POA: Diagnosis not present

## 2018-09-12 DIAGNOSIS — H02831 Dermatochalasis of right upper eyelid: Secondary | ICD-10-CM | POA: Diagnosis not present

## 2018-10-01 DIAGNOSIS — Z23 Encounter for immunization: Secondary | ICD-10-CM | POA: Diagnosis not present

## 2018-10-08 NOTE — Patient Instructions (Addendum)
Your procedure is scheduled on:  10/18/2018               Report to [facility] at  10:00   AM.  Call this number if you have problems the morning of surgery: [phone]   Remember:   Do not eat or drink :After Midnight.  Take these medicines the morning of surgery with A SIP OF WATER:      Zetia      Do not wear jewelry, make-up or nail polish.  Do not wear lotions, powders, or perfumes. You may wear deodorant.  Do not bring valuables to the hospital.  Contacts, dentures or bridgework may not be worn into surgery.  Patients discharged the day of surgery will not be allowed to drive home.  Name and phone number of your driver.                                                                                                                                  Cataract Surgery  A cataract is a clouding of the lens of the eye. When a lens becomes cloudy, vision is reduced based on the degree and nature of the clouding. Surgery may be needed to improve vision. Surgery removes the cloudy lens and usually replaces it with a substitute lens (intraocular lens, IOL). LET YOUR EYE DOCTOR KNOW ABOUT:  Allergies to food or medicine.   Medicines taken including herbs, eyedrops, over-the-counter medicines, and creams.   Use of steroids (by mouth or creams).   Previous problems with anesthetics or numbing medicine.   History of bleeding problems or blood clots.   Previous surgery.   Other health problems, including diabetes and kidney problems.   Possibility of pregnancy, if this applies.  RISKS AND COMPLICATIONS  Infection.   Inflammation of the eyeball (endophthalmitis) that can spread to both eyes (sympathetic ophthalmia).   Poor wound healing.   If an IOL is inserted, it can later fall out of proper position. This is very uncommon.   Clouding of the part of your eye that holds an IOL in place. This is called an "after-cataract." These are uncommon, but easily treated.  BEFORE THE  PROCEDURE  Do not eat or drink anything except small amounts of water for 8 to 12 before your surgery, or as directed by your caregiver.    Unless you are told otherwise, continue any eyedrops you have been prescribed.   Talk to your primary caregiver about all other medicines that you take (both prescription and non-prescription). In some cases, you may need to stop or change medicines near the time of your surgery. This is most important if you are taking blood-thinning medicine. Do not stop medicines unless you are told to do so.   Arrange for someone to drive you to and from the procedure.   Do not put contact lenses in either eye on the day of your surgery.  PROCEDURE  There is more than one method for safely removing a cataract. Your doctor can explain the differences and help determine which is best for you. Phacoemulsification surgery is the most common form of cataract surgery.  An injection is given behind the eye or eyedrops are given to make this a painless procedure.   A small cut (incision) is made on the edge of the clear, dome-shaped surface that covers the front of the eye (cornea).   A tiny probe is painlessly inserted into the eye. This device gives off ultrasound waves that soften and break up the cloudy center of the lens. This makes it easier for the cloudy lens to be removed by suction.   An IOL may be implanted.   The normal lens of the eye is covered by a clear capsule. Part of that capsule is intentionally left in the eye to support the IOL.   Your surgeon may or may not use stitches to close the incision.  There are other forms of cataract surgery that require a larger incision and stiches to close the eye. This approach is taken in cases where the doctor feels that the cataract cannot be easily removed using phacoemulsification. AFTER THE PROCEDURE  When an IOL is implanted, it does not need care. It becomes a permanent part of your eye and cannot be seen or  felt.   Your doctor will schedule follow-up exams to check on your progress.   Review your other medicines with your doctor to see which can be resumed after surgery.   Use eyedrops or take medicine as prescribed by your doctor.  Document Released: 12/22/2010 Document Reviewed: 12/19/2010 [facility] Patient Information 2012 [place].  .Cataract Surgery Care After Refer to this sheet in the next few weeks. These instructions provide you with information on caring for yourself after your procedure. Your caregiver may also give you more specific instructions. Your treatment has been planned according to current medical practices, but problems sometimes occur. Call your caregiver if you have any problems or questions after your procedure.  HOME CARE INSTRUCTIONS   Avoid strenuous activities as directed by your caregiver.   Ask your caregiver when you can resume driving.   Use eyedrops or other medicines to help healing and control pressure inside your eye as directed by your caregiver.   Only take over-the-counter or prescription medicines for pain, discomfort, or fever as directed by your caregiver.   Do not to touch or rub your eyes.   You may be instructed to use a protective shield during the first few days and nights after surgery. If not, wear sunglasses to protect your eyes. This is to protect the eye from pressure or from being accidentally bumped.   Keep the area around your eye clean and dry. Avoid swimming or allowing water to hit you directly in the face while showering. Keep soap and shampoo out of your eyes.   Do not bend or lift heavy objects. Bending increases pressure in the eye. You can walk, climb stairs, and do light household chores.   Do not put a contact lens into the eye that had surgery until your caregiver says it is okay to do so.   Ask your doctor when you can return to work. This will depend on the kind of work that you do. If you work in a dusty  environment, you may be advised to wear protective eyewear for a period of time.   Ask your caregiver when it will be  safe to engage in sexual activity.   Continue with your regular eye exams as directed by your caregiver.  What to expect:  It is normal to feel itching and mild discomfort for a few days after cataract surgery. Some fluid discharge is also common, and your eye may be sensitive to light and touch.   After 1 to 2 days, even moderate discomfort should disappear. In most cases, healing will take about 6 weeks.   If you received an intraocular lens (IOL), you may notice that colors are very bright or have a blue tinge. Also, if you have been in bright sunlight, everything may appear reddish for a few hours. If you see these color tinges, it is because your lens is clear and no longer cloudy. Within a few months after receiving an IOL, these extra colors should go away. When you have healed, you will probably need new glasses.  SEEK MEDICAL CARE IF:   You have increased bruising around your eye.   You have discomfort not helped by medicine.  SEEK IMMEDIATE MEDICAL CARE IF:   You have a  fever.   You have a worsening or sudden vision loss.   You have redness, swelling, or increasing pain in the eye.   You have a thick discharge from the eye that had surgery.  MAKE SURE YOU:  Understand these instructions.   Will watch your condition.   Will get help right away if you are not doing well or get worse.  Document Released: 07/22/2004 Document Revised: 12/22/2010 Document Reviewed: 08/26/2010 [facility] Patient Information 2012 [place].    Monitored Anesthesia Care  Monitored anesthesia care is an anesthesia service for a medical procedure. Anesthesia is the loss of the ability to feel pain. It is produced by medications called anesthetics. It may affect a small area of your body (local anesthesia), a large area of your body (regional anesthesia), or your entire body  (general anesthesia). The need for monitored anesthesia care depends your procedure, your condition, and the potential need for regional or general anesthesia. It is often provided during procedures where:   General anesthesia may be needed if there are complications. This is because you need special care when you are under general anesthesia.    You will be under local or regional anesthesia. This is so that you are able to have higher levels of anesthesia if needed.    You will receive calming medications (sedatives). This is especially the case if sedatives are given to put you in a semi-conscious state of relaxation (deep sedation). This is because the amount of sedative needed to produce this state can be hard to predict. Too much of a sedative can produce general anesthesia. Monitored anesthesia care is performed by one or more caregivers who have special training in all types of anesthesia. You will need to meet with these caregivers before your procedure. During this meeting, they will ask you about your medical history. They will also give you instructions to follow. (For example, you will need to stop eating and drinking before your procedure. You may also need to stop or change medications you are taking.) During your procedure, your caregivers will stay with you. They will:   Watch your condition. This includes watching you blood pressure, breathing, and level of pain.    Diagnose and treat problems that occur.    Give medications if they are needed. These may include calming medications (sedatives) and anesthetics.    Make sure you are  comfortable.   Having monitored anesthesia care does not necessarily mean that you will be under anesthesia. It does mean that your caregivers will be able to manage anesthesia if you need it or if it occurs. It also means that you will be able to have a different type of anesthesia than you are having if you need it. When your procedure is complete, your  caregivers will continue to watch your condition. They will make sure any medications wear off before you are allowed to go home.  Document Released: 09/28/2004 Document Revised: 04/29/2012 Document Reviewed: 02/14/2012 [facility] Patient Information 2014 [place], [place].

## 2018-10-11 NOTE — H&P (Signed)
Surgical History & Physical  Patient Name: Shelia Pierce DOB: September 15, 1940  Surgery: Cataract extraction with intraocular lens implant phacoemulsification; Left Eye  Surgeon: [name] MD Surgery Date:  10/18/2018 Pre-Op Date:  10/10/2018  HPI: A 86 Yr. old female patient 1. 1. The patient complains of difficulty when viewing TV, reading closed caption, news scrolls on TV, which began 1 year ago. Both eyes are affected. The episode is gradual. Symptoms occur when the patient is driving and outside. The complaint is associated with glare. States that vision is worse in the left eye. This is negatively affecting the patient's quality of life.  Medical History: Diabetes High Blood Pressure Osteoprosis  Review of Systems Negative Allergic/Immunologic Negative Cardiovascular Negative Constitutional Negative Ear, Nose, Mouth & Throat Negative Endocrine Negative Eyes Negative Gastrointestinal Negative Genitourinary Negative Hemotologic/Lymphatic Negative Integumentary Negative Musculoskeletal Negative Neurological Negative Psychiatry Negative Respiratory  Social   Never smoked   Medication Calcium Supplement, Fish Oil, Vit b12, Vitamin D3, Metformin, Lisinopril, Alendronate,   Sx/Procedures Hysterectomy, Foot Surgery, Breast Biopsy,   Drug Allergies   NKDA  History & Physical: Heent:  Cataract, Left eye NECK: supple without bruits LUNGS: lungs clear to auscultation CV: regular rate and rhythm Abdomen: soft and non-tender Impression & Plan: Assessment: 1.  COMBINED FORMS AGE RELATED CATARACT; Both Eyes (H25.813) 2.  DERMATOCHALASIS, no surgery; Right Upper Lid, Left Upper Lid (H02.831, [id number])  Plan: 1.  Cataract accounts for the patient's decreased vision. This visual impairment is not correctable with a tolerable change in glasses or contact lenses. Cataract surgery with an implantation of a new lens should significantly improve the visual and functional status  of the patient. Discussed all risks, benefits, alternatives, and potential complications. Discussed the procedures and recovery. Patient desires to have surgery. A-scan ordered and performed today for intra-ocular lens calculations. The surgery will be performed in order to improve vision for driving, reading, and for eye examinations. Recommend phacoemulsification with intra-ocular lens. Left Eye worse - first. Dilates well - shugarcaine by protocol. 2.  Consider addressing after cataract surgery.

## 2018-10-14 ENCOUNTER — Ambulatory Visit: Payer: Medicare Other | Admitting: Obstetrics & Gynecology

## 2018-10-15 ENCOUNTER — Ambulatory Visit (INDEPENDENT_AMBULATORY_CARE_PROVIDER_SITE_OTHER): Payer: Medicare Other | Admitting: Obstetrics & Gynecology

## 2018-10-15 ENCOUNTER — Encounter (HOSPITAL_COMMUNITY): Payer: Self-pay

## 2018-10-15 ENCOUNTER — Encounter (HOSPITAL_COMMUNITY)
Admission: RE | Admit: 2018-10-15 | Discharge: 2018-10-15 | Disposition: A | Discharge status: Home / Self Care | Payer: Medicare Other | Source: Ambulatory Visit | Attending: Ophthalmology | Admitting: Ophthalmology

## 2018-10-15 ENCOUNTER — Other Ambulatory Visit: Payer: Self-pay

## 2018-10-15 ENCOUNTER — Other Ambulatory Visit (HOSPITAL_COMMUNITY)
Admission: RE | Admit: 2018-10-15 | Discharge: 2018-10-15 | Disposition: A | Discharge status: Home / Self Care | Payer: Medicare Other | Source: Ambulatory Visit | Attending: Ophthalmology | Admitting: Ophthalmology

## 2018-10-15 ENCOUNTER — Encounter: Payer: Self-pay | Admitting: Obstetrics & Gynecology

## 2018-10-15 VITALS — BP 171/99 | HR 68 | Ht 60.0 in | Wt 160.0 lb

## 2018-10-15 DIAGNOSIS — Z20828 Contact with and (suspected) exposure to other viral communicable diseases: Secondary | ICD-10-CM | POA: Insufficient documentation

## 2018-10-15 DIAGNOSIS — Z01812 Encounter for preprocedural laboratory examination: Secondary | ICD-10-CM | POA: Diagnosis not present

## 2018-10-15 DIAGNOSIS — Z4689 Encounter for fitting and adjustment of other specified devices: Secondary | ICD-10-CM | POA: Diagnosis not present

## 2018-10-15 DIAGNOSIS — N993 Prolapse of vaginal vault after hysterectomy: Secondary | ICD-10-CM

## 2018-10-15 DIAGNOSIS — E119 Type 2 diabetes mellitus without complications: Secondary | ICD-10-CM | POA: Diagnosis not present

## 2018-10-15 HISTORY — DX: Other complications of anesthesia, initial encounter: T88.59XA

## 2018-10-15 HISTORY — DX: Other specified postprocedural states: Z98.890

## 2018-10-15 HISTORY — DX: Nausea with vomiting, unspecified: R11.2

## 2018-10-15 LAB — BASIC METABOLIC PANEL
Anion gap: 7 (ref 5–15)
BUN: 17 mg/dL (ref 8–23)
CO2: 25 mmol/L (ref 22–32)
Calcium: 8.6 mg/dL — ABNORMAL LOW (ref 8.9–10.3)
Chloride: 105 mmol/L (ref 98–111)
Creatinine, Ser: 0.61 mg/dL (ref 0.44–1.00)
GFR calc Af Amer: >60 mL/min (ref >60)
GFR calc non Af Amer: >60 mL/min (ref >60)
Glucose, Bld: 112 mg/dL — ABNORMAL HIGH (ref 70–99)
Potassium: 3.6 mmol/L (ref 3.5–5.1)
Sodium: 137 mmol/L (ref 135–145)

## 2018-10-15 LAB — HEMOGLOBIN A1C
Hgb A1c MFr Bld: 6.1 % — ABNORMAL HIGH (ref 4.8–5.6)
Mean Plasma Glucose: 128.37 mg/dL

## 2018-10-15 LAB — SARS CORONAVIRUS 2 (TAT 6-24 HRS): SARS Coronavirus 2: NEGATIVE

## 2018-10-15 LAB — GLUCOSE, CAPILLARY: Glucose-Capillary: 114 mg/dL — ABNORMAL HIGH (ref 70–99)

## 2018-10-15 NOTE — Progress Notes (Signed)
Chief Complaint  Patient presents with  . pessary maintenance    Blood pressure (!) 171/99, pulse 68.  Philip Aspen presents today for routine follow up related to her pessary.   She uses a Milex ring with support #4 She reports no vaginal discharge or vaginal bleeding.  Exam reveals no undue vaginal mucosal pressure of breakdown, no discharge and no vaginal bleeding.  The pessary is removed, cleaned and replaced without difficulty.    Shelia Pierce will be sen back in 4 months for continued follow up.  Florian Buff, MD  10/15/2018 9:38 AM

## 2018-10-16 NOTE — Pre-Procedure Instructions (Signed)
HgbA1C routed to PCP. 

## 2018-10-17 MED ORDER — CYCLOPENTOLATE-PHENYLEPHRINE 0.2-1 % OP SOLN
OPHTHALMIC | Status: AC
  Filled 2018-10-17: qty 2

## 2018-10-17 MED ORDER — PHENYLEPHRINE HCL 2.5 % OP SOLN
OPHTHALMIC | Status: AC
  Filled 2018-10-17: qty 15

## 2018-10-17 MED ORDER — NEOMYCIN-POLYMYXIN-DEXAMETH 3.5-10000-0.1 OP SUSP
OPHTHALMIC | Status: AC
  Filled 2018-10-17: qty 5

## 2018-10-17 MED ORDER — LIDOCAINE HCL 3.5 % OP GEL
OPHTHALMIC | Status: AC
  Filled 2018-10-17: qty 1

## 2018-10-17 MED ORDER — TETRACAINE HCL 0.5 % OP SOLN
OPHTHALMIC | Status: AC
  Filled 2018-10-17: qty 4

## 2018-10-17 MED ORDER — LIDOCAINE HCL (PF) 1 % IJ SOLN
INTRAMUSCULAR | Status: AC
  Filled 2018-10-17: qty 2

## 2018-10-18 ENCOUNTER — Ambulatory Visit (HOSPITAL_COMMUNITY)
Admission: RE | Admit: 2018-10-18 | Discharge: 2018-10-18 | Disposition: A | Discharge status: Home / Self Care | Payer: Medicare Other | Attending: Ophthalmology | Admitting: Ophthalmology

## 2018-10-18 ENCOUNTER — Encounter (HOSPITAL_COMMUNITY): Payer: Self-pay | Admitting: *Deleted

## 2018-10-18 ENCOUNTER — Other Ambulatory Visit: Payer: Self-pay

## 2018-10-18 ENCOUNTER — Ambulatory Visit (HOSPITAL_COMMUNITY): Payer: Medicare Other | Admitting: Anesthesiology

## 2018-10-18 ENCOUNTER — Encounter (HOSPITAL_COMMUNITY)
Admission: RE | Disposition: A | Discharge status: Home / Self Care | Payer: Self-pay | Source: Home / Self Care | Attending: Ophthalmology

## 2018-10-18 DIAGNOSIS — Z79899 Other long term (current) drug therapy: Secondary | ICD-10-CM | POA: Diagnosis not present

## 2018-10-18 DIAGNOSIS — Z7984 Long term (current) use of oral hypoglycemic drugs: Secondary | ICD-10-CM | POA: Insufficient documentation

## 2018-10-18 DIAGNOSIS — E1136 Type 2 diabetes mellitus with diabetic cataract: Secondary | ICD-10-CM | POA: Diagnosis not present

## 2018-10-18 DIAGNOSIS — H25812 Combined forms of age-related cataract, left eye: Secondary | ICD-10-CM | POA: Diagnosis not present

## 2018-10-18 DIAGNOSIS — M810 Age-related osteoporosis without current pathological fracture: Secondary | ICD-10-CM | POA: Diagnosis not present

## 2018-10-18 DIAGNOSIS — I10 Essential (primary) hypertension: Secondary | ICD-10-CM | POA: Diagnosis not present

## 2018-10-18 HISTORY — PX: CATARACT EXTRACTION W/PHACO: SHX586

## 2018-10-18 LAB — GLUCOSE, CAPILLARY: Glucose-Capillary: 112 mg/dL — ABNORMAL HIGH (ref 70–99)

## 2018-10-18 SURGERY — PHACOEMULSIFICATION, CATARACT, WITH IOL INSERTION
Anesthesia: Monitor Anesthesia Care | Site: Eye | Laterality: Left

## 2018-10-18 MED ORDER — CYCLOPENTOLATE-PHENYLEPHRINE 0.2-1 % OP SOLN
1.0000 [drp] | OPHTHALMIC | Status: AC | PRN
  Administered 2018-10-18 (×3): 1 [drp] via OPHTHALMIC

## 2018-10-18 MED ORDER — EPINEPHRINE PF 1 MG/ML IJ SOLN
INTRAOCULAR | Status: DC | PRN
  Administered 2018-10-18: 12:00:00 500 mL

## 2018-10-18 MED ORDER — BSS IO SOLN
INTRAOCULAR | Status: DC | PRN
  Administered 2018-10-18: 15 mL via INTRAOCULAR

## 2018-10-18 MED ORDER — LIDOCAINE HCL (PF) 1 % IJ SOLN
INTRAOCULAR | Status: DC | PRN
  Administered 2018-10-18: 12:00:00 1 mL via OPHTHALMIC

## 2018-10-18 MED ORDER — SODIUM HYALURONATE 23 MG/ML IO SOLN
INTRAOCULAR | Status: DC | PRN
  Administered 2018-10-18: 0.6 mL via INTRAOCULAR

## 2018-10-18 MED ORDER — POVIDONE-IODINE 5 % OP SOLN
OPHTHALMIC | Status: DC | PRN
  Administered 2018-10-18: 1 via OPHTHALMIC

## 2018-10-18 MED ORDER — LIDOCAINE HCL 3.5 % OP GEL
1.0000 "application " | Freq: Once | OPHTHALMIC | Status: AC
  Administered 2018-10-18: 1 via OPHTHALMIC

## 2018-10-18 MED ORDER — PROVISC 10 MG/ML IO SOLN
INTRAOCULAR | Status: DC | PRN
  Administered 2018-10-18: 0.85 mL via INTRAOCULAR

## 2018-10-18 MED ORDER — TETRACAINE HCL 0.5 % OP SOLN
1.0000 [drp] | OPHTHALMIC | Status: AC | PRN
  Administered 2018-10-18 (×3): 1 [drp] via OPHTHALMIC

## 2018-10-18 MED ORDER — NEOMYCIN-POLYMYXIN-DEXAMETH 3.5-10000-0.1 OP SUSP
OPHTHALMIC | Status: DC | PRN
  Administered 2018-10-18: 1 [drp] via OPHTHALMIC

## 2018-10-18 MED ORDER — PHENYLEPHRINE HCL 2.5 % OP SOLN
1.0000 [drp] | OPHTHALMIC | Status: AC | PRN
  Administered 2018-10-18 (×3): 1 [drp] via OPHTHALMIC

## 2018-10-18 SURGICAL SUPPLY — 13 items

## 2018-10-18 NOTE — Interval H&P Note (Signed)
History and Physical Interval Note: The H and P was reviewed and updated. The patient was examined.  No changes were found after exam.  The surgical eye was marked.  10/18/2018 11:29 AM  Shelia Pierce  has presented today for surgery, with the diagnosis of Nuclear sclerotic cataract - Left eye.  The various methods of treatment have been discussed with the patient and family. After consideration of risks, benefits and other options for treatment, the patient has consented to  Procedure(s): CATARACT EXTRACTION PHACO AND INTRAOCULAR LENS PLACEMENT (IOC) LEFT EYE (Left) as a surgical intervention.  The patient's history has been reviewed, patient examined, no change in status, stable for surgery.  I have reviewed the patient's chart and labs.  Questions were answered to the patient's satisfaction.     [name]

## 2018-10-18 NOTE — Anesthesia Preprocedure Evaluation (Signed)
Anesthesia Evaluation  Patient identified by MRN, date of birth, ID band Patient awake    Reviewed: Allergy & Precautions, NPO status , Patient's Chart, lab work & pertinent test results  History of Anesthesia Complications (+) PONV  Airway Mallampati: II  TM Distance: >3 FB Neck ROM: Full    Dental no notable dental hx. (+) Partial Upper   Pulmonary neg pulmonary ROS,    Pulmonary exam normal breath sounds clear to auscultation       Cardiovascular Exercise Tolerance: Good hypertension, Pt. on medications negative cardio ROS Normal cardiovascular examI Rhythm:Regular Rate:Normal     Neuro/Psych negative neurological ROS  negative psych ROS   GI/Hepatic negative GI ROS, (+) Hepatitis -, UnspecifiedReports had hepatitis a long time ago -insure which type  Denies any recent jaundice   Endo/Other  negative endocrine ROSdiabetes, Type 2  Renal/GU negative Renal ROS  negative genitourinary   Musculoskeletal negative musculoskeletal ROS (+)   Abdominal   Peds negative pediatric ROS (+)  Hematology negative hematology ROS (+)   Anesthesia Other Findings   Reproductive/Obstetrics negative OB ROS                             Anesthesia Physical Anesthesia Plan  ASA: II  Anesthesia Plan: MAC   Post-op Pain Management:    Induction: Intravenous  PONV Risk Score and Plan: 3 and TIVA, Treatment may vary due to age or medical condition, Ondansetron and Midazolam  Airway Management Planned: Nasal Cannula  Additional Equipment:   Intra-op Plan:   Post-operative Plan:   Informed Consent: I have reviewed the patients History and Physical, chart, labs and discussed the procedure including the risks, benefits and alternatives for the proposed anesthesia with the patient or authorized representative who has indicated his/her understanding and acceptance.     Dental advisory given  Plan  Discussed with: CRNA  Anesthesia Plan Comments: (Plan Full PPE use  Plan MAC d/w pt -WTP with same )        Anesthesia Quick Evaluation

## 2018-10-18 NOTE — Op Note (Signed)
Date of procedure: 10/18/18  Pre-operative diagnosis: Visually significant age-related combined cataract, Left Eye (H25.812)  Post-operative diagnosis: Visually significant age-related combined cataract, Left Eye (H25.812)  Procedure: Removal of cataract via phacoemulsification and insertion of intra-ocular lens [employer] PCB00  +20.0D into the capsular bag of the Left Eye  Attending surgeon: Gerda Diss. Oshay Stranahan, MD, MA  Anesthesia: MAC, Topical Ak[place]  Complications: None  Estimated Blood Loss: <57m (minimal)  Specimens: None  Implants: As above  Indications:  Visually significant age-related cataract, Left Eye  Procedure:  The patient was seen and identified in the pre-operative area. The operative eye was identified and dilated.  The operative eye was marked.  Topical anesthesia was administered to the operative eye.     The patient was then to the operative suite and placed in the supine position.  A timeout was performed confirming the patient, procedure to be performed, and all other relevant information.   The patient's face was prepped and draped in the usual fashion for intra-ocular surgery.  A lid speculum was placed into the operative eye and the surgical microscope moved into place and focused.  An inferotemporal paracentesis was created using a 20 gauge paracentesis blade.  Shugarcaine was injected into the anterior chamber.  Viscoelastic was injected into the anterior chamber.  A temporal clear-corneal main wound incision was created using a 2.472mmicrokeratome.  A continuous curvilinear capsulorrhexis was initiated using an irrigating cystitome and completed using capsulorrhexis forceps.  Hydrodissection and hydrodeliniation were performed.  Viscoelastic was injected into the anterior chamber.  A phacoemulsification handpiece and a chopper as a second instrument were used to remove the nucleus and epinucleus. The irrigation/aspiration handpiece was used to remove  any remaining cortical material.   The capsular bag was reinflated with viscoelastic, checked, and found to be intact.  The intraocular lens was inserted into the capsular bag.  The irrigation/aspiration handpiece was used to remove any remaining viscoelastic.  The clear corneal wound and paracentesis wounds were then hydrated and checked with Weck-Cels to be watertight.  The lid-speculum and drape was removed, and the patient's face was cleaned with a wet and dry 4x4.  Maxitrol was instilled in the eye before a clear shield was taped over the eye. The patient was taken to the post-operative care unit in good condition, having tolerated the procedure well.  Post-Op Instructions: [facility] and will receive all other orders and instructions.

## 2018-10-18 NOTE — Discharge Instructions (Signed)
Please discharge patient when stable, will follow up today with Dr. Victorina Kable at the [facility] office immediately following discharge.  Leave shield in place until visit.  All paperwork with discharge instructions will be given at the office. ° °

## 2018-10-18 NOTE — Transfer of Care (Signed)
Immediate Anesthesia Transfer of Care Note  Patient: Shelia Pierce  Procedure(s) Performed: CATARACT EXTRACTION PHACO AND INTRAOCULAR LENS PLACEMENT  LEFT EYE (CDE: 22.04) (Left Eye)  Patient Location: Short Stay  Anesthesia Type:MAC  Level of Consciousness: awake, alert , oriented and patient cooperative  Airway & Oxygen Therapy: Patient Spontanous Breathing  Post-op Assessment: Report given to RN and Post -op Vital signs reviewed and stable  Post vital signs: Reviewed and stable  Last Vitals:  Vitals Value Taken Time  BP    Temp    Pulse    Resp    SpO2      Last Pain:  Vitals:   10/18/18 1039  TempSrc: Oral  PainSc: 0-No pain      Patients Stated Pain Goal: 7 (58/09/98 3382)  Complications: No apparent anesthesia complications

## 2018-10-18 NOTE — Anesthesia Postprocedure Evaluation (Signed)
Anesthesia Post Note  Patient: Shelia Pierce  Procedure(s) Performed: CATARACT EXTRACTION PHACO AND INTRAOCULAR LENS PLACEMENT  LEFT EYE (CDE: 22.04) (Left Eye)  Patient location during evaluation: Short Stay Anesthesia Type: MAC Level of consciousness: awake and alert Pain management: pain level controlled Vital Signs Assessment: post-procedure vital signs reviewed and stable Respiratory status: spontaneous breathing Cardiovascular status: stable Postop Assessment: no apparent nausea or vomiting Anesthetic complications: no     Last Vitals:  Vitals:   10/18/18 1039  BP: (!) 189/92  Pulse: 69  Resp: 19  Temp: 36.8 C  SpO2: 98%    Last Pain:  Vitals:   10/18/18 1039  TempSrc: Oral  PainSc: 0-No pain                 Everette Rank

## 2018-10-21 ENCOUNTER — Encounter (HOSPITAL_COMMUNITY): Payer: Self-pay | Admitting: Ophthalmology

## 2018-10-25 NOTE — H&P (Addendum)
Surgical History & Physical  Patient Name: [place] Kraning DOB: Jun 20, 1940  Surgery: Cataract extraction with intraocular lens implant phacoemulsification; Right Eye  Surgeon: [name] MD Surgery Date:  11/15/2018 Pre-Op Date:  10/24/2018  HPI: A 75 Yr. old female patient 1. 1. The patient complains of difficulty when viewing TV, reading closed caption, news scrolls on TV, which began 1 year ago. The right eye is affected. The episode is gradual. The condition's severity increased since last visit. Symptoms occur when the patient is inside and outside. This is negatively affecting the patient's quality of life. 2. The patient is returning after cataract post-op. The left eye is affected. Status post cataract post-op, which began 1 week ago: Since the last visit, the affected area feels improvement. The patient's vision is improved. Patient is following medication instructions. HPI was performed by [name] .  Medical History: Diabetes High Blood Pressure Osteoprosis  Review of Systems Negative Allergic/Immunologic Negative Cardiovascular Negative Constitutional Negative Ear, Nose, Mouth & Throat Negative Endocrine Negative Eyes Negative Gastrointestinal Negative Genitourinary Negative Hemotologic/Lymphatic Negative Integumentary Negative Musculoskeletal Negative Neurological Negative Psychiatry Negative Respiratory  Social   Never smoked  Medication Omni,  Calcium Supplement, Fish Oil, Vit b12, Vitamin D3, Metformin, Lisinopril, Alendronate,   Sx/Procedures Phaco c IOL,  Hysterectomy, Foot Surgery, Breast Biopsy,   Drug Allergies   NKDA  History & Physical: Heent:  Cataract, Right eye NECK: supple without bruits LUNGS: lungs clear to auscultation CV: regular rate and rhythm Abdomen: soft and non-tender  Impression & Plan: Assessment: 1.  CATARACT EXTRACTION STATUS; Left Eye (Z98.42) 2.  COMBINED FORMS AGE RELATED CATARACT; Right Eye (H25.811)  Plan:  1.  Same day post-op exam. Doing well. All post-op precautions discussed and instructions reviewed. Written instructions given. Continue Gati-Brom-Pred 2x/day for 3 more weeks. 2.  Cataract accounts for the patient's decreased vision. This visual impairment is not correctable with a tolerable change in glasses or contact lenses. Cataract surgery with an implantation of a new lens should significantly improve the visual and functional status of the patient. Discussed all risks, benefits, alternatives, and potential complications. Discussed the procedures and recovery. Patient desires to have surgery. A-scan ordered and performed today for intra-ocular lens calculations. The surgery will be performed in order to improve vision for driving, reading, and for eye examinations. Recommend phacoemulsification with intra-ocular lens. Right Eye. Surgery required to correct imbalance of vision. Dilates well - shugarcaine by protocol.

## 2018-10-30 ENCOUNTER — Other Ambulatory Visit: Payer: Self-pay

## 2018-10-30 ENCOUNTER — Other Ambulatory Visit (HOSPITAL_COMMUNITY)
Admission: RE | Admit: 2018-10-30 | Discharge: 2018-10-30 | Disposition: A | Discharge status: Home / Self Care | Payer: Medicare Other | Source: Ambulatory Visit | Attending: Ophthalmology | Admitting: Ophthalmology

## 2018-10-30 ENCOUNTER — Encounter (HOSPITAL_COMMUNITY)
Admission: RE | Admit: 2018-10-30 | Discharge: 2018-10-30 | Disposition: A | Discharge status: Home / Self Care | Payer: Medicare Other | Source: Ambulatory Visit | Attending: Ophthalmology | Admitting: Ophthalmology

## 2018-10-30 DIAGNOSIS — Z01812 Encounter for preprocedural laboratory examination: Secondary | ICD-10-CM | POA: Diagnosis not present

## 2018-10-30 DIAGNOSIS — Z20828 Contact with and (suspected) exposure to other viral communicable diseases: Secondary | ICD-10-CM | POA: Insufficient documentation

## 2018-10-30 LAB — SARS CORONAVIRUS 2 (TAT 6-24 HRS): SARS Coronavirus 2: NEGATIVE

## 2018-11-07 DIAGNOSIS — H25811 Combined forms of age-related cataract, right eye: Secondary | ICD-10-CM | POA: Diagnosis not present

## 2018-11-12 ENCOUNTER — Other Ambulatory Visit: Payer: Self-pay

## 2018-11-12 ENCOUNTER — Encounter (HOSPITAL_COMMUNITY)
Admission: RE | Admit: 2018-11-12 | Discharge: 2018-11-12 | Disposition: A | Discharge status: Home / Self Care | Payer: Medicare Other | Source: Ambulatory Visit | Attending: Ophthalmology | Admitting: Ophthalmology

## 2018-11-13 ENCOUNTER — Other Ambulatory Visit (HOSPITAL_COMMUNITY)
Admission: RE | Admit: 2018-11-13 | Discharge: 2018-11-13 | Disposition: A | Discharge status: Home / Self Care | Payer: Medicare Other | Source: Ambulatory Visit | Attending: Ophthalmology | Admitting: Ophthalmology

## 2018-11-13 ENCOUNTER — Other Ambulatory Visit: Payer: Self-pay

## 2018-11-13 DIAGNOSIS — Z20828 Contact with and (suspected) exposure to other viral communicable diseases: Secondary | ICD-10-CM | POA: Diagnosis not present

## 2018-11-13 DIAGNOSIS — Z01812 Encounter for preprocedural laboratory examination: Secondary | ICD-10-CM | POA: Insufficient documentation

## 2018-11-13 LAB — SARS CORONAVIRUS 2 (TAT 6-24 HRS): SARS Coronavirus 2: NEGATIVE

## 2018-11-15 ENCOUNTER — Ambulatory Visit (HOSPITAL_COMMUNITY)
Admission: RE | Admit: 2018-11-15 | Discharge: 2018-11-15 | Disposition: A | Discharge status: Home / Self Care | Payer: Medicare Other | Attending: Ophthalmology | Admitting: Ophthalmology

## 2018-11-15 ENCOUNTER — Ambulatory Visit (HOSPITAL_COMMUNITY): Payer: Medicare Other | Admitting: Anesthesiology

## 2018-11-15 ENCOUNTER — Encounter (HOSPITAL_COMMUNITY)
Admission: RE | Disposition: A | Discharge status: Home / Self Care | Payer: Self-pay | Source: Home / Self Care | Attending: Ophthalmology

## 2018-11-15 DIAGNOSIS — H25811 Combined forms of age-related cataract, right eye: Secondary | ICD-10-CM | POA: Diagnosis not present

## 2018-11-15 DIAGNOSIS — M810 Age-related osteoporosis without current pathological fracture: Secondary | ICD-10-CM | POA: Diagnosis not present

## 2018-11-15 DIAGNOSIS — Z7983 Long term (current) use of bisphosphonates: Secondary | ICD-10-CM | POA: Insufficient documentation

## 2018-11-15 DIAGNOSIS — Z79899 Other long term (current) drug therapy: Secondary | ICD-10-CM | POA: Diagnosis not present

## 2018-11-15 DIAGNOSIS — Z7984 Long term (current) use of oral hypoglycemic drugs: Secondary | ICD-10-CM | POA: Insufficient documentation

## 2018-11-15 DIAGNOSIS — E1136 Type 2 diabetes mellitus with diabetic cataract: Secondary | ICD-10-CM | POA: Diagnosis not present

## 2018-11-15 DIAGNOSIS — I10 Essential (primary) hypertension: Secondary | ICD-10-CM | POA: Diagnosis not present

## 2018-11-15 HISTORY — PX: CATARACT EXTRACTION W/PHACO: SHX586

## 2018-11-15 LAB — GLUCOSE, CAPILLARY: Glucose-Capillary: 117 mg/dL — ABNORMAL HIGH (ref 70–99)

## 2018-11-15 SURGERY — PHACOEMULSIFICATION, CATARACT, WITH IOL INSERTION
Anesthesia: Monitor Anesthesia Care | Site: Eye | Laterality: Right

## 2018-11-15 MED ORDER — BSS IO SOLN
INTRAOCULAR | Status: DC | PRN
  Administered 2018-11-15: 15 mL via INTRAOCULAR

## 2018-11-15 MED ORDER — PHENYLEPHRINE HCL 2.5 % OP SOLN
1.0000 [drp] | OPHTHALMIC | Status: AC | PRN
  Administered 2018-11-15 (×3): 1 [drp] via OPHTHALMIC

## 2018-11-15 MED ORDER — TETRACAINE HCL 0.5 % OP SOLN
1.0000 [drp] | OPHTHALMIC | Status: AC | PRN
  Administered 2018-11-15 (×3): 1 [drp] via OPHTHALMIC

## 2018-11-15 MED ORDER — CYCLOPENTOLATE-PHENYLEPHRINE 0.2-1 % OP SOLN
1.0000 [drp] | OPHTHALMIC | Status: AC | PRN
  Administered 2018-11-15 (×3): 1 [drp] via OPHTHALMIC

## 2018-11-15 MED ORDER — PROVISC 10 MG/ML IO SOLN
INTRAOCULAR | Status: DC | PRN
  Administered 2018-11-15: 0.85 mL via INTRAOCULAR

## 2018-11-15 MED ORDER — POVIDONE-IODINE 5 % OP SOLN
OPHTHALMIC | Status: DC | PRN
  Administered 2018-11-15: 1 via OPHTHALMIC

## 2018-11-15 MED ORDER — EPINEPHRINE PF 1 MG/ML IJ SOLN
INTRAOCULAR | Status: DC | PRN
  Administered 2018-11-15: 500 mL

## 2018-11-15 MED ORDER — LIDOCAINE HCL 3.5 % OP GEL: 1.0000 "application " | Freq: Once | OPHTHALMIC | Status: DC

## 2018-11-15 MED ORDER — SODIUM HYALURONATE 23 MG/ML IO SOLN
INTRAOCULAR | Status: DC | PRN
  Administered 2018-11-15: 0.6 mL via INTRAOCULAR

## 2018-11-15 MED ORDER — NEOMYCIN-POLYMYXIN-DEXAMETH 3.5-10000-0.1 OP SUSP
OPHTHALMIC | Status: DC | PRN
  Administered 2018-11-15: 1 [drp] via OPHTHALMIC

## 2018-11-15 MED ORDER — LIDOCAINE HCL (PF) 1 % IJ SOLN
INTRAOCULAR | Status: DC | PRN
  Administered 2018-11-15: 1 mL via OPHTHALMIC

## 2018-11-15 SURGICAL SUPPLY — 13 items

## 2018-11-15 NOTE — Op Note (Signed)
Date of procedure: 11/15/18  Pre-operative diagnosis:  Visually significant combined form age-related cataract, Right Eye (H25.811)  Post-operative diagnosis:  Visually significant combined form age-related cataract, Right Eye (H25.811)  Procedure: Removal of cataract via phacoemulsification and insertion of intra-ocular lens Wynetta Emery and [employer] PCB00  +19.5D into the capsular bag of the Right Eye  Attending surgeon: Gerda Diss. Farris Blash, MD, MA  Anesthesia: MAC, Topical Akten  Complications: None  Estimated Blood Loss: <36m (minimal)  Specimens: None  Implants: As above  Indications:  Visually significant age-related cataract, Right Eye  Procedure:  The patient was seen and identified in the pre-operative area. The operative eye was identified and dilated.  The operative eye was marked.  Topical anesthesia was administered to the operative eye.     The patient was then to the operative suite and placed in the supine position.  A timeout was performed confirming the patient, procedure to be performed, and all other relevant information.   The patient's face was prepped and draped in the usual fashion for intra-ocular surgery.  A lid speculum was placed into the operative eye and the surgical microscope moved into place and focused.  A superotemporal paracentesis was created using a 20 gauge paracentesis blade.  Shugarcaine was injected into the anterior chamber.  Viscoelastic was injected into the anterior chamber.  A temporal clear-corneal main wound incision was created using a 2.428mmicrokeratome.  A continuous curvilinear capsulorrhexis was initiated using an irrigating cystitome and completed using capsulorrhexis forceps.  Hydrodissection and hydrodeliniation were performed.  Viscoelastic was injected into the anterior chamber.  A phacoemulsification handpiece and a chopper as a second instrument were used to remove the nucleus and epinucleus. The irrigation/aspiration handpiece was  used to remove any remaining cortical material.   The capsular bag was reinflated with viscoelastic, checked, and found to be intact.  The intraocular lens was inserted into the capsular bag.  The irrigation/aspiration handpiece was used to remove any remaining viscoelastic.  The clear corneal wound and paracentesis wounds were then hydrated and checked with Weck-Cels to be watertight.  The lid-speculum and drape was removed, and the patient's face was cleaned with a wet and dry 4x4.  Maxitrol was instilled in the eye before a clear shield was taped over the eye. The patient was taken to the post-operative care unit in good condition, having tolerated the procedure well.  Post-Op Instructions: [facility] and will receive all other orders and instructions.

## 2018-11-15 NOTE — Transfer of Care (Signed)
Immediate Anesthesia Transfer of Care Note  Patient: Shelia Pierce  Procedure(s) Performed: CATARACT EXTRACTION PHACO AND INTRAOCULAR LENS PLACEMENT (IOC) (Right Eye)  Patient Location: Short Stay  Anesthesia Type:MAC  Level of Consciousness: awake, alert  and patient cooperative  Airway & Oxygen Therapy: Patient Spontanous Breathing  Post-op Assessment: Report given to RN and Post -op Vital signs reviewed and stable  Post vital signs: Reviewed and stable  Last Vitals:  Vitals Value Taken Time  BP 186/86 11/15/2018 0820  Temp 98.2   Pulse 61   Resp 18   SpO2 94     Last Pain:  Vitals:   11/15/18 0712  TempSrc: Oral  PainSc: 0-No pain      Patients Stated Pain Goal: 5 (55/[id number]/41 [id number])  Complications: No apparent anesthesia complications

## 2018-11-15 NOTE — Anesthesia Postprocedure Evaluation (Signed)
Anesthesia Post Note  Patient: Shelia Pierce  Procedure(s) Performed: CATARACT EXTRACTION PHACO AND INTRAOCULAR LENS PLACEMENT (IOC) (Right Eye)  Patient location during evaluation: Short Stay Anesthesia Type: MAC Level of consciousness: awake and alert Pain management: pain level controlled Vital Signs Assessment: post-procedure vital signs reviewed and stable Respiratory status: spontaneous breathing Cardiovascular status: stable Postop Assessment: no apparent nausea or vomiting Anesthetic complications: no     Last Vitals:  Vitals:   11/15/18 0712 11/15/18 0822  BP: (!) 185/83 (!) 184/84  Pulse: 65   Resp: 18   Temp: 36.8 C 36.8 C  SpO2: 96% 97%    Last Pain:  Vitals:   11/15/18 0822  TempSrc: Oral  PainSc: 0-No pain                 Tempest Frankland

## 2018-11-15 NOTE — Anesthesia Procedure Notes (Signed)
Procedure Name: MAC Date/Time: 11/15/2018 7:56 AM Performed by: Vista Deck, CRNA Pre-anesthesia Checklist: Patient identified, Emergency Drugs available, Suction available, Timeout performed and Patient being monitored Patient Re-evaluated:Patient Re-evaluated prior to induction Oxygen Delivery Method: Nasal Cannula

## 2018-11-15 NOTE — Anesthesia Preprocedure Evaluation (Signed)
Anesthesia Evaluation  Patient identified by MRN, date of birth, ID band Patient awake    Reviewed: Allergy & Precautions, NPO status , Patient's Chart, lab work & pertinent test results  History of Anesthesia Complications (+) PONV and history of anesthetic complications  Airway Mallampati: II  TM Distance: >3 FB Neck ROM: Full    Dental  (+) Partial Upper   Pulmonary neg pulmonary ROS,    Pulmonary exam normal breath sounds clear to auscultation       Cardiovascular Exercise Tolerance: Good hypertension, Normal cardiovascular exam Rhythm:Regular Rate:Normal     Neuro/Psych negative neurological ROS  negative psych ROS   GI/Hepatic negative GI ROS, (+) Hepatitis -  Endo/Other  diabetes, Well Controlled, Type 2, Oral Hypoglycemic Agents  Renal/GU negative Renal ROS  negative genitourinary   Musculoskeletal   Abdominal   Peds negative pediatric ROS (+)  Hematology   Anesthesia Other Findings   Reproductive/Obstetrics                           Anesthesia Physical Anesthesia Plan  ASA: II  Anesthesia Plan: MAC   Post-op Pain Management:    Induction:   PONV Risk Score and Plan: Ondansetron  Airway Management Planned: Nasal Cannula and Natural Airway  Additional Equipment:   Intra-op Plan:   Post-operative Plan:   Informed Consent: I have reviewed the patients History and Physical, chart, labs and discussed the procedure including the risks, benefits and alternatives for the proposed anesthesia with the patient or authorized representative who has indicated his/her understanding and acceptance.     Dental advisory given  Plan Discussed with: CRNA  Anesthesia Plan Comments:        Anesthesia Quick Evaluation

## 2018-11-15 NOTE — Discharge Instructions (Signed)
Please discharge patient when stable, will follow up today with Dr. Kamau Weatherall at the [facility] office immediately following discharge.  Leave shield in place until visit.  All paperwork with discharge instructions will be given at the office. ° °

## 2018-11-15 NOTE — Interval H&P Note (Signed)
History and Physical Interval Note: The H and P was reviewed and updated. The patient was examined.  No changes were found after exam.  The surgical eye was marked.  11/15/2018 7:55 AM  Shelia Pierce  has presented today for surgery, with the diagnosis of Nuclear sclerotic cataract - Right eye.  The various methods of treatment have been discussed with the patient and family. After consideration of risks, benefits and other options for treatment, the patient has consented to  Procedure(s): CATARACT EXTRACTION PHACO AND INTRAOCULAR LENS PLACEMENT ([place]) (Right) as a surgical intervention.  The patient's history has been reviewed, patient examined, no change in status, stable for surgery.  I have reviewed the patient's chart and labs.  Questions were answered to the patient's satisfaction.     [name]

## 2018-11-18 ENCOUNTER — Encounter (HOSPITAL_COMMUNITY): Payer: Self-pay | Admitting: Ophthalmology

## 2019-01-16 DIAGNOSIS — I10 Essential (primary) hypertension: Secondary | ICD-10-CM | POA: Diagnosis not present

## 2019-01-16 DIAGNOSIS — M810 Age-related osteoporosis without current pathological fracture: Secondary | ICD-10-CM | POA: Diagnosis not present

## 2019-01-16 DIAGNOSIS — Z7984 Long term (current) use of oral hypoglycemic drugs: Secondary | ICD-10-CM | POA: Diagnosis not present

## 2019-01-16 DIAGNOSIS — E119 Type 2 diabetes mellitus without complications: Secondary | ICD-10-CM | POA: Diagnosis not present

## 2019-01-29 ENCOUNTER — Other Ambulatory Visit (HOSPITAL_COMMUNITY): Payer: Self-pay | Admitting: Internal Medicine

## 2019-01-29 DIAGNOSIS — Z1231 Encounter for screening mammogram for malignant neoplasm of breast: Secondary | ICD-10-CM

## 2019-02-05 ENCOUNTER — Other Ambulatory Visit: Payer: Self-pay

## 2019-02-05 ENCOUNTER — Ambulatory Visit (HOSPITAL_COMMUNITY)
Admission: RE | Admit: 2019-02-05 | Discharge: 2019-02-05 | Disposition: A | Discharge status: Home / Self Care | Payer: Medicare Other | Source: Ambulatory Visit | Attending: Internal Medicine | Admitting: Internal Medicine

## 2019-02-05 DIAGNOSIS — Z1231 Encounter for screening mammogram for malignant neoplasm of breast: Secondary | ICD-10-CM | POA: Insufficient documentation

## 2019-02-14 ENCOUNTER — Other Ambulatory Visit: Payer: Self-pay

## 2019-02-14 ENCOUNTER — Encounter: Payer: Self-pay | Admitting: Obstetrics and Gynecology

## 2019-02-14 ENCOUNTER — Ambulatory Visit: Payer: Medicare Other | Admitting: Obstetrics and Gynecology

## 2019-02-14 DIAGNOSIS — N8189 Other female genital prolapse: Secondary | ICD-10-CM

## 2019-02-14 DIAGNOSIS — O26892 Other specified pregnancy related conditions, second trimester: Secondary | ICD-10-CM

## 2019-02-14 DIAGNOSIS — Z3A22 22 weeks gestation of pregnancy: Secondary | ICD-10-CM

## 2019-02-14 NOTE — Progress Notes (Signed)
Patient ID: Shelia Pierce, female   DOB: 12-02-1940, 79 y.o.   MRN: [id number] [name] is here for pessary check up. She has no complaints..  PE AF VSS Lungs clear Heart RRR Abd soft + BS GU pessary removed and cleaned with soap and water, replaced without problems No evidence of vaginal erosion   A/P Pelvic Relaxation Mirlex # 5 with support F/U in 4 months

## 2019-02-16 DIAGNOSIS — Z720 Tobacco use: Secondary | ICD-10-CM | POA: Diagnosis not present

## 2019-02-16 DIAGNOSIS — E119 Type 2 diabetes mellitus without complications: Secondary | ICD-10-CM | POA: Diagnosis not present

## 2019-02-16 DIAGNOSIS — E7849 Other hyperlipidemia: Secondary | ICD-10-CM | POA: Diagnosis not present

## 2019-02-16 DIAGNOSIS — M810 Age-related osteoporosis without current pathological fracture: Secondary | ICD-10-CM | POA: Diagnosis not present

## 2019-02-16 DIAGNOSIS — J449 Chronic obstructive pulmonary disease, unspecified: Secondary | ICD-10-CM | POA: Diagnosis not present

## 2019-02-16 DIAGNOSIS — I10 Essential (primary) hypertension: Secondary | ICD-10-CM | POA: Diagnosis not present

## 2019-02-16 DIAGNOSIS — N182 Chronic kidney disease, stage 2 (mild): Secondary | ICD-10-CM | POA: Diagnosis not present

## 2019-02-21 DIAGNOSIS — Z0001 Encounter for general adult medical examination with abnormal findings: Secondary | ICD-10-CM | POA: Diagnosis not present

## 2019-02-21 DIAGNOSIS — E119 Type 2 diabetes mellitus without complications: Secondary | ICD-10-CM | POA: Diagnosis not present

## 2019-02-21 DIAGNOSIS — M4120 Other idiopathic scoliosis, site unspecified: Secondary | ICD-10-CM | POA: Diagnosis not present

## 2019-02-21 DIAGNOSIS — Z1389 Encounter for screening for other disorder: Secondary | ICD-10-CM | POA: Diagnosis not present

## 2019-02-21 DIAGNOSIS — E7849 Other hyperlipidemia: Secondary | ICD-10-CM | POA: Diagnosis not present

## 2019-02-21 DIAGNOSIS — I10 Essential (primary) hypertension: Secondary | ICD-10-CM | POA: Diagnosis not present

## 2019-02-21 DIAGNOSIS — M810 Age-related osteoporosis without current pathological fracture: Secondary | ICD-10-CM | POA: Diagnosis not present

## 2019-04-16 DIAGNOSIS — M810 Age-related osteoporosis without current pathological fracture: Secondary | ICD-10-CM | POA: Diagnosis not present

## 2019-04-16 DIAGNOSIS — E119 Type 2 diabetes mellitus without complications: Secondary | ICD-10-CM | POA: Diagnosis not present

## 2019-04-16 DIAGNOSIS — I10 Essential (primary) hypertension: Secondary | ICD-10-CM | POA: Diagnosis not present

## 2019-04-16 DIAGNOSIS — E7849 Other hyperlipidemia: Secondary | ICD-10-CM | POA: Diagnosis not present

## 2019-05-23 DIAGNOSIS — I10 Essential (primary) hypertension: Secondary | ICD-10-CM | POA: Diagnosis not present

## 2019-05-23 DIAGNOSIS — Z681 Body mass index (BMI) 19 or less, adult: Secondary | ICD-10-CM | POA: Diagnosis not present

## 2019-05-23 DIAGNOSIS — E782 Mixed hyperlipidemia: Secondary | ICD-10-CM | POA: Diagnosis not present

## 2019-05-23 DIAGNOSIS — E119 Type 2 diabetes mellitus without complications: Secondary | ICD-10-CM | POA: Diagnosis not present

## 2019-05-23 DIAGNOSIS — M25562 Pain in left knee: Secondary | ICD-10-CM | POA: Diagnosis not present

## 2019-05-23 DIAGNOSIS — E7849 Other hyperlipidemia: Secondary | ICD-10-CM | POA: Diagnosis not present

## 2019-06-09 DIAGNOSIS — M25562 Pain in left knee: Secondary | ICD-10-CM | POA: Diagnosis not present

## 2019-06-09 DIAGNOSIS — R531 Weakness: Secondary | ICD-10-CM | POA: Diagnosis not present

## 2019-06-09 DIAGNOSIS — R2689 Other abnormalities of gait and mobility: Secondary | ICD-10-CM | POA: Diagnosis not present

## 2019-06-09 DIAGNOSIS — M25662 Stiffness of left knee, not elsewhere classified: Secondary | ICD-10-CM | POA: Diagnosis not present

## 2019-06-11 DIAGNOSIS — M25662 Stiffness of left knee, not elsewhere classified: Secondary | ICD-10-CM | POA: Diagnosis not present

## 2019-06-11 DIAGNOSIS — R2689 Other abnormalities of gait and mobility: Secondary | ICD-10-CM | POA: Diagnosis not present

## 2019-06-11 DIAGNOSIS — R531 Weakness: Secondary | ICD-10-CM | POA: Diagnosis not present

## 2019-06-11 DIAGNOSIS — M25562 Pain in left knee: Secondary | ICD-10-CM | POA: Diagnosis not present

## 2019-06-13 ENCOUNTER — Ambulatory Visit: Payer: Medicare Other | Admitting: Obstetrics & Gynecology

## 2019-06-17 DIAGNOSIS — M25562 Pain in left knee: Secondary | ICD-10-CM | POA: Diagnosis not present

## 2019-06-17 DIAGNOSIS — R531 Weakness: Secondary | ICD-10-CM | POA: Diagnosis not present

## 2019-06-17 DIAGNOSIS — M25662 Stiffness of left knee, not elsewhere classified: Secondary | ICD-10-CM | POA: Diagnosis not present

## 2019-06-17 DIAGNOSIS — R2689 Other abnormalities of gait and mobility: Secondary | ICD-10-CM | POA: Diagnosis not present

## 2019-06-23 DIAGNOSIS — R531 Weakness: Secondary | ICD-10-CM | POA: Diagnosis not present

## 2019-06-23 DIAGNOSIS — R2689 Other abnormalities of gait and mobility: Secondary | ICD-10-CM | POA: Diagnosis not present

## 2019-06-23 DIAGNOSIS — M25662 Stiffness of left knee, not elsewhere classified: Secondary | ICD-10-CM | POA: Diagnosis not present

## 2019-06-23 DIAGNOSIS — M25562 Pain in left knee: Secondary | ICD-10-CM | POA: Diagnosis not present

## 2019-06-24 ENCOUNTER — Ambulatory Visit: Payer: Medicare Other | Admitting: Obstetrics & Gynecology

## 2019-06-24 ENCOUNTER — Encounter: Payer: Self-pay | Admitting: Obstetrics & Gynecology

## 2019-06-24 VITALS — BP 159/84 | HR 61 | Ht 60.0 in | Wt 147.0 lb

## 2019-06-24 DIAGNOSIS — N8189 Other female genital prolapse: Secondary | ICD-10-CM

## 2019-06-24 DIAGNOSIS — Z466 Encounter for fitting and adjustment of urinary device: Secondary | ICD-10-CM

## 2019-06-24 DIAGNOSIS — Z4689 Encounter for fitting and adjustment of other specified devices: Secondary | ICD-10-CM

## 2019-06-24 NOTE — Progress Notes (Signed)
Chief Complaint  Patient presents with  . Pessary Check    Blood pressure (!) 159/84, pulse 61, height 5' (1.524 m), weight 147 lb (66.7 kg).  Shelia Pierce presents today for routine follow up related to her pessary.   She uses a Milex ring with support #5 She reports no vaginal discharge or vaginal bleeding.  Exam reveals no undue vaginal mucosal pressure of breakdown, no discharge and no vaginal bleeding.  The pessary is removed, cleaned and replaced without difficulty.    Shelia Pierce will be sen back in 4 months for continued follow up.  Florian Buff, MD  06/24/2019 10:42 AM

## 2019-07-01 DIAGNOSIS — R2689 Other abnormalities of gait and mobility: Secondary | ICD-10-CM | POA: Diagnosis not present

## 2019-07-01 DIAGNOSIS — M25562 Pain in left knee: Secondary | ICD-10-CM | POA: Diagnosis not present

## 2019-07-01 DIAGNOSIS — M25662 Stiffness of left knee, not elsewhere classified: Secondary | ICD-10-CM | POA: Diagnosis not present

## 2019-07-01 DIAGNOSIS — R531 Weakness: Secondary | ICD-10-CM | POA: Diagnosis not present

## 2019-07-08 DIAGNOSIS — R531 Weakness: Secondary | ICD-10-CM | POA: Diagnosis not present

## 2019-07-08 DIAGNOSIS — R2689 Other abnormalities of gait and mobility: Secondary | ICD-10-CM | POA: Diagnosis not present

## 2019-07-08 DIAGNOSIS — M25562 Pain in left knee: Secondary | ICD-10-CM | POA: Diagnosis not present

## 2019-07-08 DIAGNOSIS — M25662 Stiffness of left knee, not elsewhere classified: Secondary | ICD-10-CM | POA: Diagnosis not present

## 2019-09-04 DIAGNOSIS — E119 Type 2 diabetes mellitus without complications: Secondary | ICD-10-CM | POA: Diagnosis not present

## 2019-09-04 DIAGNOSIS — I10 Essential (primary) hypertension: Secondary | ICD-10-CM | POA: Diagnosis not present

## 2019-10-16 DIAGNOSIS — E7849 Other hyperlipidemia: Secondary | ICD-10-CM | POA: Diagnosis not present

## 2019-10-16 DIAGNOSIS — I10 Essential (primary) hypertension: Secondary | ICD-10-CM | POA: Diagnosis not present

## 2019-10-16 DIAGNOSIS — M810 Age-related osteoporosis without current pathological fracture: Secondary | ICD-10-CM | POA: Diagnosis not present

## 2019-10-16 DIAGNOSIS — E119 Type 2 diabetes mellitus without complications: Secondary | ICD-10-CM | POA: Diagnosis not present

## 2019-10-22 DIAGNOSIS — Z23 Encounter for immunization: Secondary | ICD-10-CM | POA: Diagnosis not present

## 2019-10-24 ENCOUNTER — Other Ambulatory Visit: Payer: Self-pay

## 2019-10-24 ENCOUNTER — Encounter: Payer: Self-pay | Admitting: Obstetrics & Gynecology

## 2019-10-24 ENCOUNTER — Ambulatory Visit: Payer: Medicare Other | Admitting: Obstetrics & Gynecology

## 2019-10-24 VITALS — BP 168/88 | HR 63 | Ht 61.0 in | Wt 144.5 lb

## 2019-10-24 DIAGNOSIS — N993 Prolapse of vaginal vault after hysterectomy: Secondary | ICD-10-CM | POA: Diagnosis not present

## 2019-10-24 DIAGNOSIS — N8189 Other female genital prolapse: Secondary | ICD-10-CM

## 2019-10-24 DIAGNOSIS — Z4689 Encounter for fitting and adjustment of other specified devices: Secondary | ICD-10-CM | POA: Diagnosis not present

## 2019-10-24 NOTE — Progress Notes (Signed)
Chief Complaint  Patient presents with  . Pessary Check    Blood pressure (!) 168/88, pulse 63, height 5\' 1"  (1.549 m), weight 144 lb 8 oz (65.5 kg).  Shelia Pierce presents today for routine follow up related to her pessary.   She uses a Milex ring with support #5 She reports no vaginal discharge or vaginal bleeding.  Exam reveals no undue vaginal mucosal pressure of breakdown, no discharge and no vaginal bleeding.  The pessary is removed, cleaned and replaced without difficulty.    [name] will be sen back in 4 months for continued follow up.  Florian Buff, MD  10/24/2019 10:10 AM

## 2019-11-15 DIAGNOSIS — E7849 Other hyperlipidemia: Secondary | ICD-10-CM | POA: Diagnosis not present

## 2019-11-15 DIAGNOSIS — M810 Age-related osteoporosis without current pathological fracture: Secondary | ICD-10-CM | POA: Diagnosis not present

## 2019-11-15 DIAGNOSIS — I10 Essential (primary) hypertension: Secondary | ICD-10-CM | POA: Diagnosis not present

## 2019-11-15 DIAGNOSIS — E119 Type 2 diabetes mellitus without complications: Secondary | ICD-10-CM | POA: Diagnosis not present

## 2019-12-25 IMAGING — DX LEFT KNEE - COMPLETE 4+ VIEW
4 series · 4 of 4 positions shown · non-contrast
Comparison: None.

CLINICAL DATA: Left knee pain

EXAM:
LEFT KNEE - COMPLETE 4+ VIEW

[knee ap]
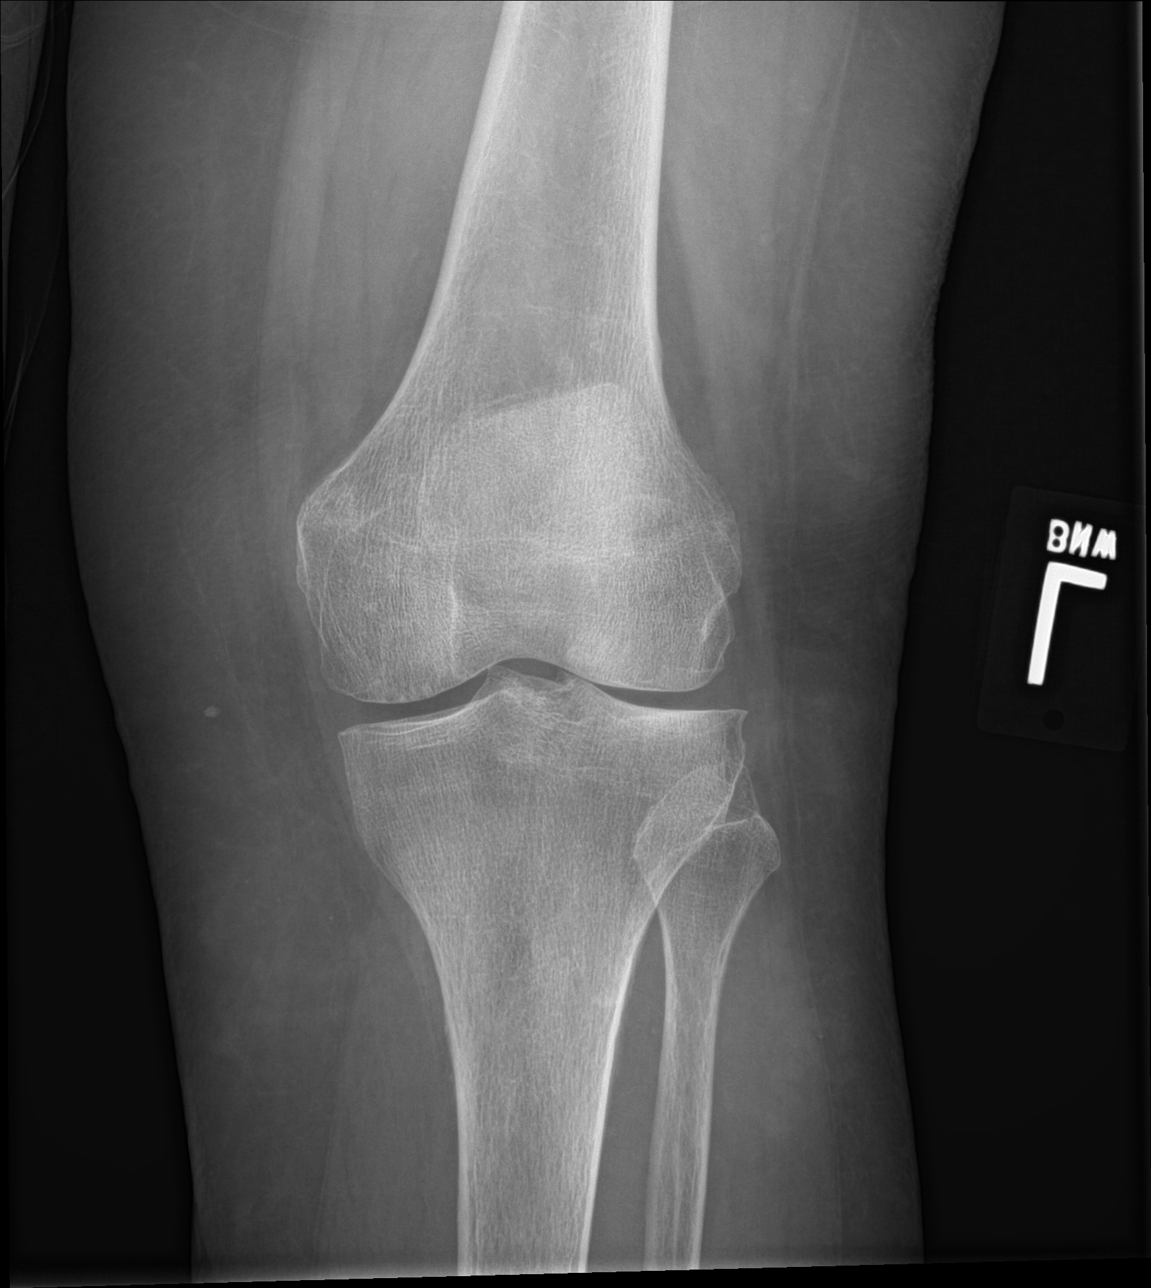

[knee lat]
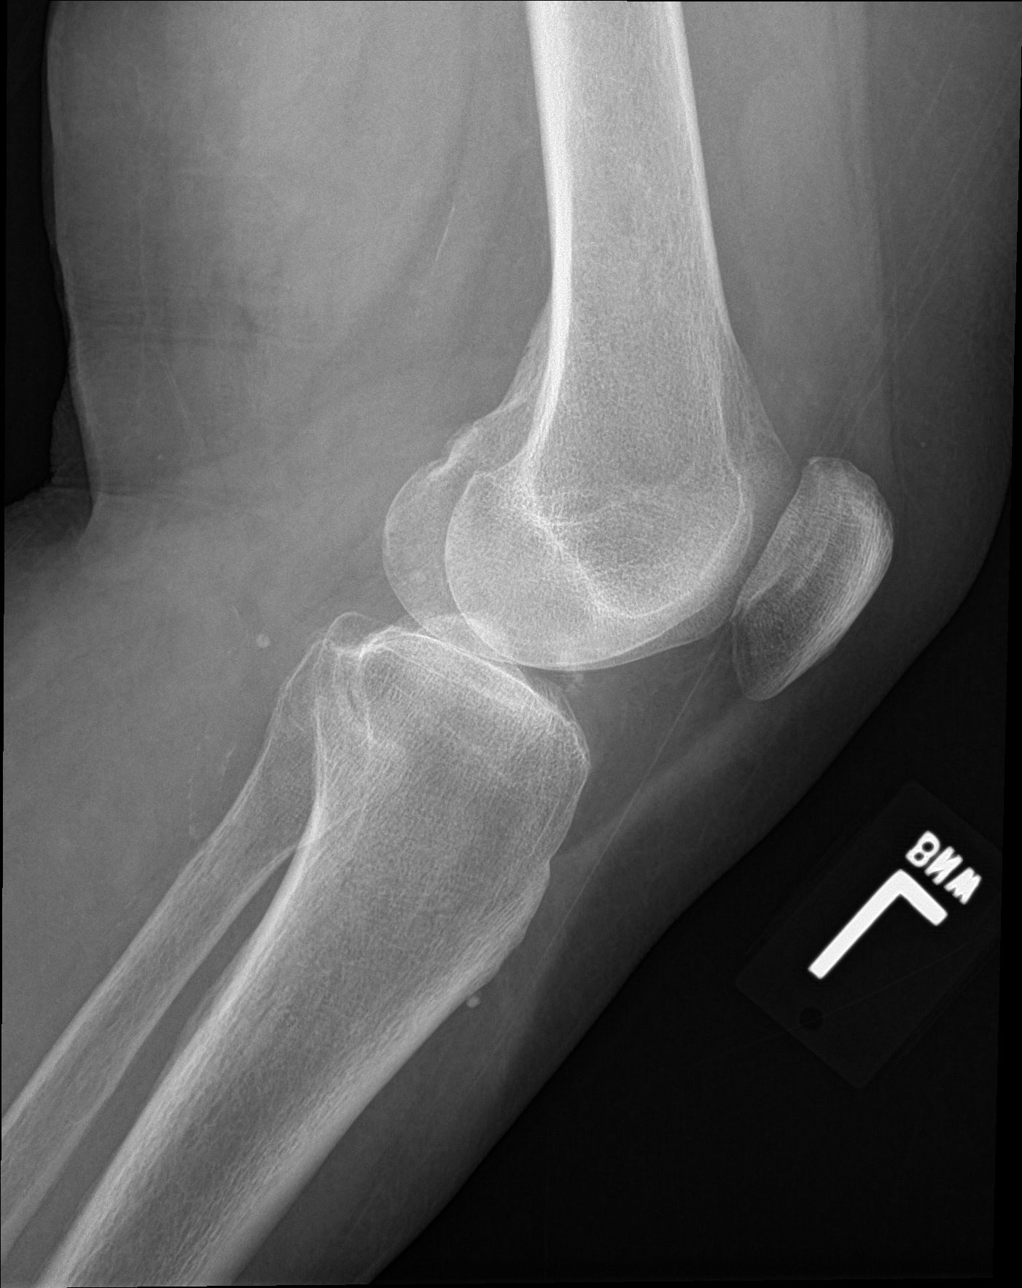

[knee obl (1 of 2)]
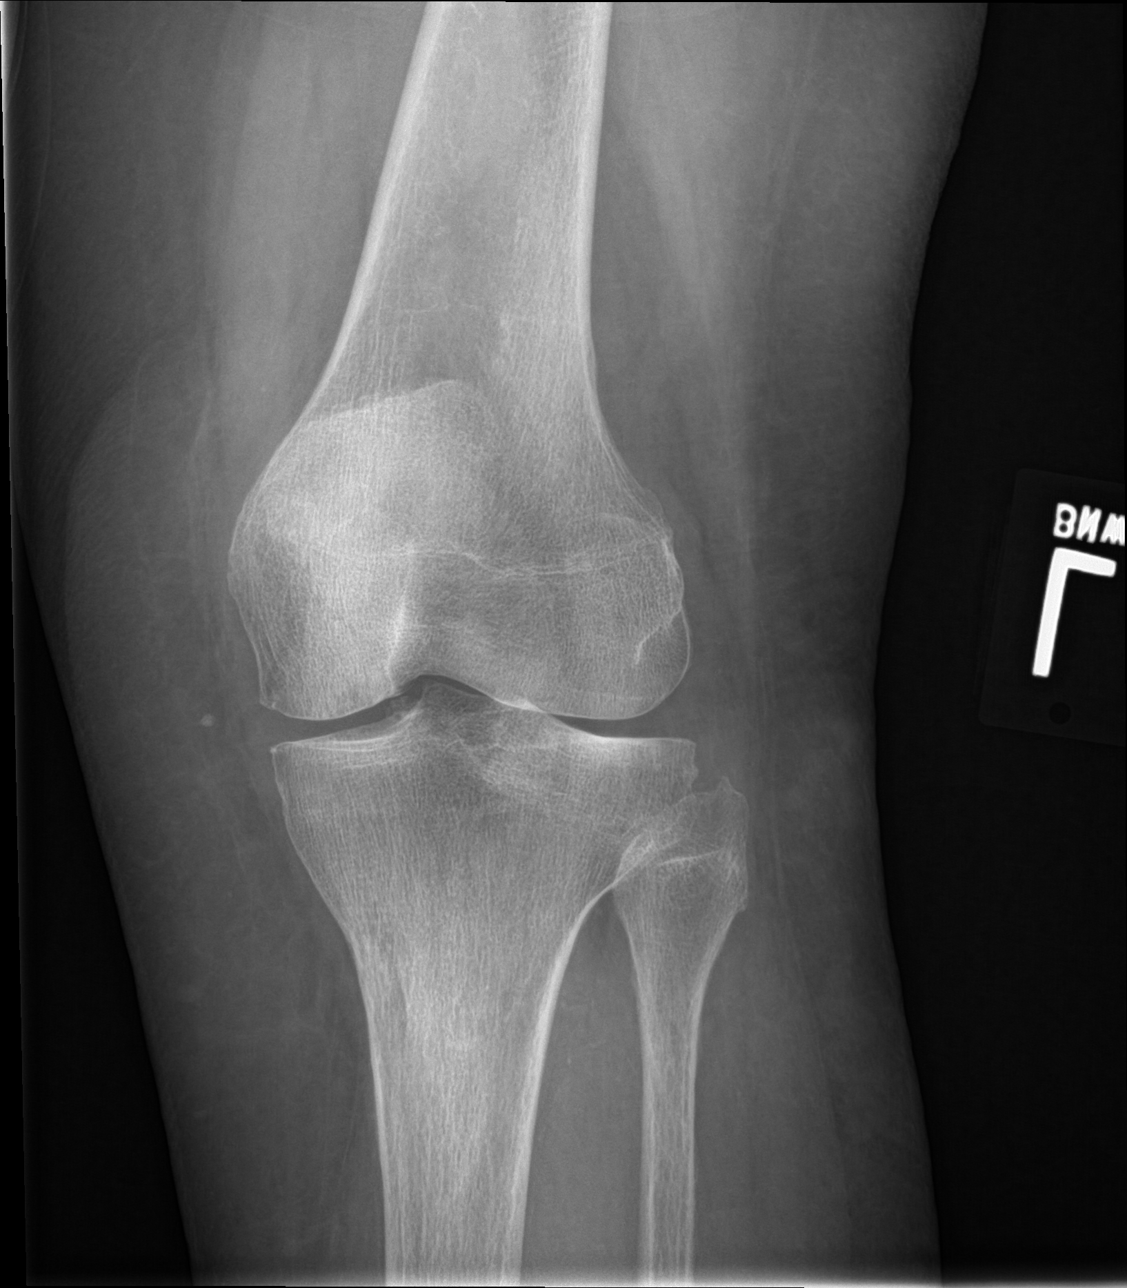

[knee obl (2 of 2)]
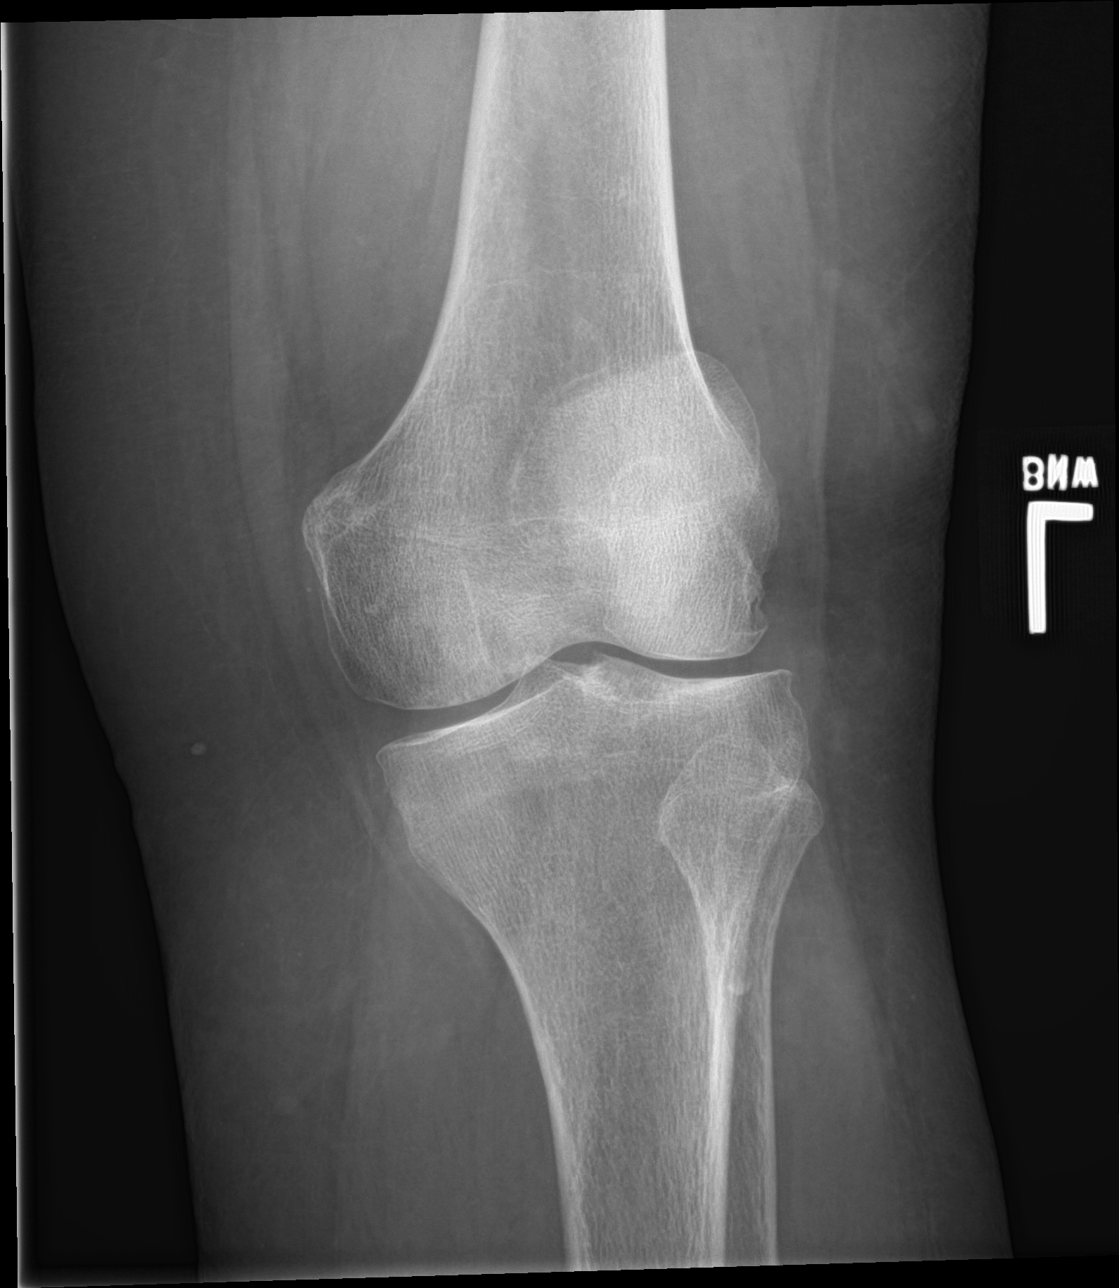

[4 of 4 positions shown; findings below may reference images not displayed]

FINDINGS: Moderate joint effusion. Slight joint space narrowing and early
spurring in the lateral compartment. No acute bony abnormality.
Specifically, no fracture, subluxation, or dislocation.
IMPRESSION: Early osteoarthritis in the lateral compartment. Moderate joint
effusion. No acute bony abnormality.

## 2020-01-14 ENCOUNTER — Other Ambulatory Visit (HOSPITAL_COMMUNITY): Payer: Self-pay | Admitting: Internal Medicine

## 2020-01-14 DIAGNOSIS — Z1231 Encounter for screening mammogram for malignant neoplasm of breast: Secondary | ICD-10-CM

## 2020-01-22 ENCOUNTER — Other Ambulatory Visit (HOSPITAL_COMMUNITY): Payer: Self-pay | Admitting: Family Medicine

## 2020-01-22 DIAGNOSIS — Z1389 Encounter for screening for other disorder: Secondary | ICD-10-CM

## 2020-02-09 ENCOUNTER — Ambulatory Visit (HOSPITAL_COMMUNITY): Payer: Medicare Other

## 2020-02-11 ENCOUNTER — Other Ambulatory Visit: Payer: Self-pay

## 2020-02-11 ENCOUNTER — Ambulatory Visit (HOSPITAL_COMMUNITY)
Admission: RE | Admit: 2020-02-11 | Discharge: 2020-02-11 | Disposition: A | Discharge status: Home / Self Care | Payer: Medicare Other | Source: Ambulatory Visit | Attending: Family Medicine | Admitting: Family Medicine

## 2020-02-11 ENCOUNTER — Ambulatory Visit (HOSPITAL_COMMUNITY)
Admission: RE | Admit: 2020-02-11 | Discharge: 2020-02-11 | Disposition: A | Discharge status: Home / Self Care | Payer: Medicare Other | Source: Ambulatory Visit | Attending: Internal Medicine | Admitting: Internal Medicine

## 2020-02-11 DIAGNOSIS — Z1231 Encounter for screening mammogram for malignant neoplasm of breast: Secondary | ICD-10-CM | POA: Insufficient documentation

## 2020-02-11 DIAGNOSIS — Z13820 Encounter for screening for osteoporosis: Secondary | ICD-10-CM | POA: Insufficient documentation

## 2020-02-11 DIAGNOSIS — Z780 Asymptomatic menopausal state: Secondary | ICD-10-CM | POA: Diagnosis not present

## 2020-02-11 DIAGNOSIS — M810 Age-related osteoporosis without current pathological fracture: Secondary | ICD-10-CM | POA: Diagnosis not present

## 2020-02-11 DIAGNOSIS — Z1389 Encounter for screening for other disorder: Secondary | ICD-10-CM

## 2020-02-11 DIAGNOSIS — Z8739 Personal history of other diseases of the musculoskeletal system and connective tissue: Secondary | ICD-10-CM | POA: Diagnosis not present

## 2020-02-11 DIAGNOSIS — R29890 Loss of height: Secondary | ICD-10-CM | POA: Diagnosis not present

## 2020-02-14 DIAGNOSIS — E119 Type 2 diabetes mellitus without complications: Secondary | ICD-10-CM | POA: Diagnosis not present

## 2020-02-14 DIAGNOSIS — M810 Age-related osteoporosis without current pathological fracture: Secondary | ICD-10-CM | POA: Diagnosis not present

## 2020-02-14 DIAGNOSIS — I10 Essential (primary) hypertension: Secondary | ICD-10-CM | POA: Diagnosis not present

## 2020-02-14 DIAGNOSIS — E7849 Other hyperlipidemia: Secondary | ICD-10-CM | POA: Diagnosis not present

## 2020-02-24 ENCOUNTER — Encounter: Payer: Self-pay | Admitting: Obstetrics & Gynecology

## 2020-02-24 ENCOUNTER — Other Ambulatory Visit: Payer: Self-pay

## 2020-02-24 ENCOUNTER — Ambulatory Visit: Payer: Medicare Other | Admitting: Obstetrics & Gynecology

## 2020-02-24 VITALS — BP 186/93 | HR 68 | Wt 148.0 lb

## 2020-02-24 DIAGNOSIS — N993 Prolapse of vaginal vault after hysterectomy: Secondary | ICD-10-CM

## 2020-02-24 DIAGNOSIS — Z4689 Encounter for fitting and adjustment of other specified devices: Secondary | ICD-10-CM | POA: Diagnosis not present

## 2020-02-24 NOTE — Progress Notes (Signed)
Chief Complaint  Patient presents with  . Pessary Check    Blood pressure (!) 186/93, pulse 68, weight 148 lb (67.1 kg).  Shelia Pierce presents today for routine follow up related to her pessary.   She uses a Milex ring with support #5 She reports no vaginal discharge and no vaginal bleeding   Likert scale(1 not bothersome -5 very bothersome)  :  1  Exam reveals no undue vaginal mucosal pressure of breakdown, no discharge and no vaginal bleeding.  Vaginal Epithelial Abnormality Classification System:   0 0    No abnormalities 1    Epithelial erythema 2    Granulation tissue 3    Epithelial break or erosion, 1 cm or less 4    Epithelial break or erosion, 1 cm or greater  The pessary is removed, cleaned and replaced without difficulty.      ICD-10-CM   1. 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  Florian Buff, MD  02/24/2020 9:37 AM

## 2020-03-10 DIAGNOSIS — E7849 Other hyperlipidemia: Secondary | ICD-10-CM | POA: Diagnosis not present

## 2020-03-10 DIAGNOSIS — E119 Type 2 diabetes mellitus without complications: Secondary | ICD-10-CM | POA: Diagnosis not present

## 2020-03-10 DIAGNOSIS — M810 Age-related osteoporosis without current pathological fracture: Secondary | ICD-10-CM | POA: Diagnosis not present

## 2020-03-10 DIAGNOSIS — Z1389 Encounter for screening for other disorder: Secondary | ICD-10-CM | POA: Diagnosis not present

## 2020-03-10 DIAGNOSIS — M4120 Other idiopathic scoliosis, site unspecified: Secondary | ICD-10-CM | POA: Diagnosis not present

## 2020-03-10 DIAGNOSIS — Z0001 Encounter for general adult medical examination with abnormal findings: Secondary | ICD-10-CM | POA: Diagnosis not present

## 2020-03-10 DIAGNOSIS — I10 Essential (primary) hypertension: Secondary | ICD-10-CM | POA: Diagnosis not present

## 2020-03-15 DIAGNOSIS — I10 Essential (primary) hypertension: Secondary | ICD-10-CM | POA: Diagnosis not present

## 2020-03-15 DIAGNOSIS — E119 Type 2 diabetes mellitus without complications: Secondary | ICD-10-CM | POA: Diagnosis not present

## 2020-03-15 DIAGNOSIS — M810 Age-related osteoporosis without current pathological fracture: Secondary | ICD-10-CM | POA: Diagnosis not present

## 2020-03-15 DIAGNOSIS — E7849 Other hyperlipidemia: Secondary | ICD-10-CM | POA: Diagnosis not present

## 2020-03-29 DIAGNOSIS — I10 Essential (primary) hypertension: Secondary | ICD-10-CM | POA: Diagnosis not present

## 2020-03-29 DIAGNOSIS — N10 Acute tubulo-interstitial nephritis: Secondary | ICD-10-CM | POA: Diagnosis not present

## 2020-03-29 DIAGNOSIS — M810 Age-related osteoporosis without current pathological fracture: Secondary | ICD-10-CM | POA: Diagnosis not present

## 2020-04-14 DIAGNOSIS — N342 Other urethritis: Secondary | ICD-10-CM | POA: Diagnosis not present

## 2020-04-14 DIAGNOSIS — I10 Essential (primary) hypertension: Secondary | ICD-10-CM | POA: Diagnosis not present

## 2020-04-15 ENCOUNTER — Encounter (HOSPITAL_COMMUNITY): Payer: Self-pay | Admitting: Emergency Medicine

## 2020-04-15 ENCOUNTER — Other Ambulatory Visit: Payer: Self-pay

## 2020-04-15 ENCOUNTER — Emergency Department (HOSPITAL_COMMUNITY): Payer: Medicare Other

## 2020-04-15 ENCOUNTER — Emergency Department (HOSPITAL_COMMUNITY)
Admission: EM | Admit: 2020-04-15 | Discharge: 2020-04-15 | Disposition: A | Discharge status: Home / Self Care | Payer: Medicare Other | Attending: Emergency Medicine | Admitting: Emergency Medicine

## 2020-04-15 DIAGNOSIS — D72829 Elevated white blood cell count, unspecified: Secondary | ICD-10-CM | POA: Diagnosis not present

## 2020-04-15 DIAGNOSIS — E119 Type 2 diabetes mellitus without complications: Secondary | ICD-10-CM | POA: Insufficient documentation

## 2020-04-15 DIAGNOSIS — I10 Essential (primary) hypertension: Secondary | ICD-10-CM | POA: Insufficient documentation

## 2020-04-15 DIAGNOSIS — N3001 Acute cystitis with hematuria: Secondary | ICD-10-CM | POA: Diagnosis not present

## 2020-04-15 DIAGNOSIS — M25551 Pain in right hip: Secondary | ICD-10-CM | POA: Diagnosis not present

## 2020-04-15 DIAGNOSIS — K6389 Other specified diseases of intestine: Secondary | ICD-10-CM | POA: Diagnosis not present

## 2020-04-15 DIAGNOSIS — K449 Diaphragmatic hernia without obstruction or gangrene: Secondary | ICD-10-CM | POA: Diagnosis not present

## 2020-04-15 DIAGNOSIS — K429 Umbilical hernia without obstruction or gangrene: Secondary | ICD-10-CM | POA: Diagnosis not present

## 2020-04-15 LAB — URINALYSIS, ROUTINE W REFLEX MICROSCOPIC
Bilirubin Urine: NEGATIVE
Glucose, UA: NEGATIVE mg/dL
Ketones, ur: >80 mg/dL — AB
Nitrite: NEGATIVE
Protein, ur: NEGATIVE mg/dL
Specific Gravity, Urine: 1.02 (ref 1.005–1.030)
pH: 6 (ref 5.0–8.0)

## 2020-04-15 LAB — URINALYSIS, MICROSCOPIC (REFLEX)

## 2020-04-15 MED ORDER — LIDOCAINE 5 % EX OINT: 1.0000 "application " | TOPICAL_OINTMENT | Freq: Three times a day (TID) | CUTANEOUS | 0 refills | Status: DC | PRN

## 2020-04-15 MED ORDER — LIDOCAINE 5 % EX PTCH
1.0000 | MEDICATED_PATCH | CUTANEOUS | Status: DC
  Administered 2020-04-15: 1 via TRANSDERMAL
  Filled 2020-04-15: qty 1

## 2020-04-15 MED ORDER — ACETAMINOPHEN 500 MG PO TABS
1000.0000 mg | ORAL_TABLET | Freq: Once | ORAL | Status: AC
  Administered 2020-04-15: 1000 mg via ORAL
  Filled 2020-04-15: qty 2

## 2020-04-15 NOTE — ED Triage Notes (Signed)
Pt c/o of right hip pain since Sunday. Denies any new injuries

## 2020-04-15 NOTE — Discharge Instructions (Signed)
You were seen in the emergency department today with hip pain.  Your x-rays and CT scans did not show any fractures.  Continue your antibiotics started by your primary care doctor yesterday for urine infection.  I have sent a culture to make sure the antibiotics you are taking are sensitive to this.  If you develop worsening pain symptoms please return to the emergency department, otherwise, please follow with your primary care doctor in the coming week.  If your symptoms persist you may need additional outpatient tests or referrals.

## 2020-04-15 NOTE — ED Provider Notes (Signed)
Emergency Department Provider Note   I have reviewed the triage vital signs and the nursing notes.   HISTORY  Chief Complaint Hip Pain   HPI Shelia Pierce is a 80 y.o. female with PMH reviewed below presents to the emergency department valuation of pain in the right hip without obvious trauma.  Symptoms been present for the past several days.  She is being treated as an outpatient for a urinary tract infection.  She was initially started on Cipro but transition to a second, unknown antibiotic, yesterday at her PCP office with continued findings on UA to suggest infection.  She thought that initially the pain in the right hip was related to the urine infection.  She has not had fevers or chills.  She does note some occasional discomfort in the right flank.  No midline back pain.  No numbness or weakness.  Pain does not radiate down the leg. No fever. No chills.   Past Medical History:  Diagnosis Date  . Colon polyps   . Complication of anesthesia   . Diabetes mellitus without complication ([place])    Diet controlled  . Hypercholesteremia   . Hypertension   . PONV (postoperative nausea and vomiting)     Patient Active Problem List   Diagnosis Date Noted  . Pelvic relaxation 02/14/2019  . Rectal bleeding 04/26/2017    Past Surgical History:  Procedure Laterality Date  . ABDOMINAL HYSTERECTOMY    . CATARACT EXTRACTION W/PHACO Left 10/18/2018   Procedure: CATARACT EXTRACTION PHACO AND INTRAOCULAR LENS PLACEMENT  LEFT EYE (CDE: 22.04);  Surgeon: [name], MD;  Location: [facility];  Service: Ophthalmology;  Laterality: Left;  . CATARACT EXTRACTION W/PHACO Right 11/15/2018   Procedure: CATARACT EXTRACTION PHACO AND INTRAOCULAR LENS PLACEMENT (IOC) (CDE: 23.55);  Surgeon: [name], MD;  Location: [facility];  Service: Ophthalmology;  Laterality: Right;  . COLONOSCOPY    . COLONOSCOPY N/A 11/21/2012   Procedure: COLONOSCOPY;  Surgeon: Rogene Houston, MD;  Location: [facility];  Service: Endoscopy;  Laterality: N/A;  1030  . COLONOSCOPY N/A 05/25/2017   Procedure: COLONOSCOPY;  Surgeon: Rogene Houston, MD;  Location: [facility];  Service: Endoscopy;  Laterality: N/A;  830  . Left breast biopsy    . Left foot      Allergies Patient has no known allergies.  Family History  Problem Relation Age of Onset  . Other Mother        bladder infection  . Colon cancer Neg Hx     Social History Social History   Tobacco Use  . Smoking status: Never Smoker  . Smokeless tobacco: Never Used  Vaping Use  . Vaping Use: Never used  Substance Use Topics  . Alcohol use: No  . Drug use: No    Review of Systems  Constitutional: No fever/chills Eyes: No visual changes. ENT: No sore throat. Cardiovascular: Denies chest pain. Respiratory: Denies shortness of breath. Gastrointestinal: No abdominal pain. Mild pain to the right flank. No nausea, no vomiting.  No diarrhea.  No constipation. Genitourinary: Negative for dysuria. Musculoskeletal: Negative for back pain. Positive right hip pain.  Skin: Negative for rash. Neurological: Negative for headaches, focal weakness or numbness.  10-point ROS otherwise negative.  ____________________________________________   PHYSICAL EXAM:  VITAL SIGNS: ED Triage Vitals  Enc Vitals Group     BP 04/15/20 1046 (!) 183/88     Pulse Rate 04/15/20 1046 67     Resp 04/15/20 1046 18  Temp 04/15/20 1046 98.2 F (36.8 C)     Temp Source 04/15/20 1046 Oral     SpO2 04/15/20 1046 97 %     Weight 04/15/20 1046 145 lb (65.8 kg)     Height 04/15/20 1046 5' (1.524 m)   Constitutional: Alert and oriented. Well appearing and in no acute distress. Eyes: Conjunctivae are normal.  Head: Atraumatic. Nose: No congestion/rhinnorhea. Mouth/Throat: Mucous membranes are moist.  Neck: No stridor.   Cardiovascular: Normal rate, regular rhythm. Good peripheral circulation. Grossly normal heart sounds.   Respiratory: Normal  respiratory effort.  No retractions. Lungs CTAB. Gastrointestinal: Soft and nontender. No distention.  Musculoskeletal: No lower extremity tenderness nor edema. No gross deformities of extremities. Normal active and passive ROM of the right hip. Tenderness to the superior gluteal region without masses, cellulitis, or other acute findings.  Neurologic:  Normal speech and language. No gross focal neurologic deficits are appreciated.  Skin:  Skin is warm, dry and intact. No rash noted.  ____________________________________________   LABS (all labs ordered are listed, but only abnormal results are displayed)  Labs Reviewed  URINALYSIS, ROUTINE W REFLEX MICROSCOPIC - Abnormal; Notable for the following components:      Result Value   Hgb urine dipstick TRACE (*)    Ketones, ur >80 (*)    Leukocytes,Ua SMALL (*)    All other components within normal limits  URINALYSIS, MICROSCOPIC (REFLEX) - Abnormal; Notable for the following components:   Bacteria, UA FEW (*)    All other components within normal limits  URINE CULTURE   ____________________________________________  RADIOLOGY  CT Renal Stone Study  Result Date: 04/15/2020 CLINICAL DATA:  80 year old female with right side pain. Right hip pain for 4 days. No known injury. EXAM: CT ABDOMEN AND PELVIS WITHOUT CONTRAST TECHNIQUE: Multidetector CT imaging of the abdomen and pelvis was performed following the standard protocol without IV contrast. COMPARISON:  None. FINDINGS: Lower chest: Moderate size gastric hiatal hernia, eccentric to the left at the lung bases. No superimposed cardiomegaly. Atelectasis in the left lower lobe. No pericardial or pleural effusion. Hepatobiliary: Negative noncontrast liver and gallbladder, small calcified granuloma in the hepatic dome. Pancreas: Negative. Spleen: Negative.  Small calcified granuloma. Adrenals/Urinary Tract: Normal adrenal glands. Nonobstructed kidneys appear symmetric with no nephrolithiasis. Both  ureters are decompressed and negative. Unremarkable urinary bladder. Stomach/Bowel: Negative rectum. Redundant sigmoid colon with extensive diverticulosis from the splenic flexure through the sigmoid. No active inflammation identified. Decompressed large bowel aside from mild retained stool in the right colon. Diminutive appendix. No large bowel inflammation. Decompressed terminal ileum. No dilated small bowel. Partially intrathoracic stomach. Distal stomach and proximal duodenum are negative. There is a 3.5 cm diverticulum of the distal duodenum with no active inflammation (coronal image 36). No free air, free fluid, mesenteric inflammation. Small fat containing umbilical hernia is without inflammation on sagittal image 77. Vascular/Lymphatic: Aortoiliac calcified atherosclerosis. Calcified proximal femoral artery atherosclerosis. Vascular patency is not evaluated in the absence of IV contrast. No lymphadenopathy. Reproductive: Pessary in place. Uterus appear surgically absent. Normal ovaries. Other: No pelvic free fluid. Musculoskeletal: Osteopenia with moderate levoconvex lumbar scoliosis. Mild dextroconvex visible thoracic scoliosis. Chronic degeneration in the spine. L3-L4 spinal and right lateral recess stenosis. No acute osseous abnormality identified. IMPRESSION: 1. No acute or inflammatory process identified in the non-contrast abdomen or pelvis. 2. No urinary calculus. Moderate size gastric hiatal hernia. Diverticulosis of small and large bowel without active inflammation. Small fat containing umbilical hernia. 3. Aortic Atherosclerosis (ICD10-I70.0). 4.  Spinal scoliosis and degeneration. Electronically Signed   By: Genevie Ann M.D.   On: 04/15/2020 11:53   DG Hip Unilat W or Wo Pelvis 2-3 Views Right  Result Date: 04/15/2020 CLINICAL DATA:  80 year old female with right hip pain for 4 days. EXAM: DG HIP (WITH OR WITHOUT PELVIS) 2-3V RIGHT COMPARISON:  CT Abdomen and Pelvis today reported separately.  FINDINGS: Partially visible levoconvex lumbar scoliosis. Calcified aortic atherosclerosis. Mild artifact from pelvic pessary incidentally noted. Right hemipelvis phlebolith incidentally noted. Normal visible bowel gas pattern. Femoral heads are normally located. Intact pelvis. SI joints appear normal. Grossly intact proximal left femur. Intact proximal right femur. Hip joint spaces appear symmetric and within normal limits for age. IMPRESSION: No acute osseous abnormality identified about the right hip or pelvis. Electronically Signed   By: Genevie Ann M.D.   On: 04/15/2020 11:54    ____________________________________________   PROCEDURES  Procedure(s) performed:   Procedures   ____________________________________________   INITIAL IMPRESSION / ASSESSMENT AND PLAN / ED COURSE  Pertinent labs & imaging results that were available during my care of the patient were reviewed by me and considered in my medical decision making (see chart for details).   Patient presents to the emergency department for evaluation of pain in the right superior buttock region.  The location is more consistent with sciatica.  She has normal range of motion of the right hip and plain films show no acute bony abnormality.  She is not having any red flag signs or symptoms to suspect acute spine emergency requiring MRI.  She is describing some flank pain along with some continued urine infection symptoms and so sent for CT renal but no evidence of kidney stone.  She does have some chronic changes in the lumbar spine.  Plan for Tylenol and Lidoderm patch.  Will follow UA and reassess.  CT renal and plain films reviewed. No acute findings to explain symptoms. UA with few bacteria and small leukocytes. Will send for culture but patient just starting Macrobid yesterday. Will continue this for now. Pain in the location consistent with sciatica vs OA type pain. Plan for PCP follow up and topical meds at home. Discussed ED return  precautions. Daughter at bedside for discussion.  ____________________________________________  FINAL CLINICAL IMPRESSION(S) / ED DIAGNOSES  Final diagnoses:  Right hip pain  Acute cystitis with hematuria     MEDICATIONS GIVEN DURING THIS VISIT:  Medications  lidocaine (LIDODERM) 5 % 1 patch (1 patch Transdermal Patch Applied 04/15/20 1311)  acetaminophen (TYLENOL) tablet 1,000 mg (1,000 mg Oral Given 04/15/20 1309)     NEW OUTPATIENT MEDICATIONS STARTED DURING THIS VISIT:  Discharge Medication List as of 04/15/2020  1:58 PM    START taking these medications   Details  lidocaine (XYLOCAINE) 5 % ointment Apply 1 application topically 3 (three) times daily as needed for moderate pain., Starting Thu 04/15/2020, Normal        Note:  This document was prepared using Dragon voice recognition software and may include unintentional dictation errors.  Nanda Quinton, MD, [facility]    Lakethia Coppess, [facility], MD 04/15/20 [phone]

## 2020-04-15 NOTE — ED Notes (Signed)
Gone to CT

## 2020-04-16 LAB — URINE CULTURE: Culture: NO GROWTH

## 2020-04-23 DIAGNOSIS — M5431 Sciatica, right side: Secondary | ICD-10-CM | POA: Diagnosis not present

## 2020-04-23 DIAGNOSIS — M1991 Primary osteoarthritis, unspecified site: Secondary | ICD-10-CM | POA: Diagnosis not present

## 2020-04-23 DIAGNOSIS — G5702 Lesion of sciatic nerve, left lower limb: Secondary | ICD-10-CM | POA: Diagnosis not present

## 2020-04-23 DIAGNOSIS — N390 Urinary tract infection, site not specified: Secondary | ICD-10-CM | POA: Diagnosis not present

## 2020-04-23 DIAGNOSIS — M47816 Spondylosis without myelopathy or radiculopathy, lumbar region: Secondary | ICD-10-CM | POA: Diagnosis not present

## 2020-05-07 DIAGNOSIS — M660 Rupture of popliteal cyst: Secondary | ICD-10-CM | POA: Diagnosis not present

## 2020-05-07 DIAGNOSIS — S83282A Other tear of lateral meniscus, current injury, left knee, initial encounter: Secondary | ICD-10-CM | POA: Diagnosis not present

## 2020-05-07 DIAGNOSIS — M2342 Loose body in knee, left knee: Secondary | ICD-10-CM | POA: Diagnosis not present

## 2020-05-07 DIAGNOSIS — M1712 Unilateral primary osteoarthritis, left knee: Secondary | ICD-10-CM | POA: Diagnosis not present

## 2020-05-07 DIAGNOSIS — M25562 Pain in left knee: Secondary | ICD-10-CM | POA: Diagnosis not present

## 2020-05-10 ENCOUNTER — Other Ambulatory Visit (INDEPENDENT_AMBULATORY_CARE_PROVIDER_SITE_OTHER): Payer: Medicare Other

## 2020-05-10 ENCOUNTER — Other Ambulatory Visit: Payer: Self-pay

## 2020-05-10 DIAGNOSIS — R399 Unspecified symptoms and signs involving the genitourinary system: Secondary | ICD-10-CM | POA: Diagnosis not present

## 2020-05-10 LAB — POCT URINALYSIS DIPSTICK OB
Glucose, UA: NEGATIVE
Ketones, UA: NEGATIVE
Nitrite, UA: NEGATIVE
POC,PROTEIN,UA: NEGATIVE

## 2020-05-10 NOTE — Progress Notes (Addendum)
   NURSE VISIT- UTI SYMPTOMS   SUBJECTIVE:  Shelia Pierce is a 80 y.o. No obstetric history on file. female here for UTI symptoms. She is a GYN patient. She reports chills, flank pain bilaterally and lower abdominal pain.  OBJECTIVE:  There were no vitals taken for this visit.  Appears well, in no apparent distress  Results for orders placed or performed in visit on 05/10/20 (from the past 24 hour(s))  POC Urinalysis Dipstick OB   Collection Time: 05/10/20 10:16 AM  Result Value Ref Range   Color, UA     Clarity, UA     Glucose, UA Negative Negative   Bilirubin, UA     Ketones, UA neg    Spec Grav, UA     Blood, UA small non-hemolyzed    pH, UA     POC,PROTEIN,UA Negative Negative, Trace, Small (1+), Moderate (2+), Large (3+), 4+   Urobilinogen, UA     Nitrite, UA neg    Leukocytes, UA Large (3+) (A) Negative   Appearance     Odor      ASSESSMENT: GYN patient with UTI symptoms and negative nitrites  PLAN: Note routed to Dr. Elonda Husky   Rx sent by provider today: No Urine culture sent Call or return to clinic prn if these symptoms worsen or fail to improve as anticipated. Follow-up: as needed   Aodhan Scheidt A Lancer Thurner  05/10/2020 10:25 AM   Attestation of Attending Supervision of Nursing Visit Encounter: Evaluation and management procedures were performed by the nursing staff under my supervision and collaboration.  I have reviewed the nurse's note and chart, and I agree with the management and plan.  Jacelyn Grip MD Attending Physician for the Center for [facility] 05/10/2020 2:24 PM

## 2020-05-10 NOTE — Progress Notes (Signed)
Please send for a culture.  I won't trat unless a positive culture

## 2020-05-13 ENCOUNTER — Telehealth: Payer: Self-pay

## 2020-05-13 LAB — URINE CULTURE

## 2020-05-13 NOTE — Telephone Encounter (Signed)
-----   Message from Florian Buff, MD sent at 05/13/2020  9:13 AM EDT ----- Urine culture is negative so this is not a urinary tract infection

## 2020-05-13 NOTE — Progress Notes (Signed)
Urine culture is negative so this is not a urinary tract infection

## 2020-05-13 NOTE — Telephone Encounter (Signed)
Called pt and informed her of urine culture results. Pt confirmed understanding.

## 2020-05-15 DIAGNOSIS — I10 Essential (primary) hypertension: Secondary | ICD-10-CM | POA: Diagnosis not present

## 2020-05-15 DIAGNOSIS — M810 Age-related osteoporosis without current pathological fracture: Secondary | ICD-10-CM | POA: Diagnosis not present

## 2020-05-15 DIAGNOSIS — E119 Type 2 diabetes mellitus without complications: Secondary | ICD-10-CM | POA: Diagnosis not present

## 2020-05-15 DIAGNOSIS — E7849 Other hyperlipidemia: Secondary | ICD-10-CM | POA: Diagnosis not present

## 2020-05-18 ENCOUNTER — Other Ambulatory Visit: Payer: Self-pay

## 2020-05-18 ENCOUNTER — Encounter: Payer: Self-pay | Admitting: Obstetrics & Gynecology

## 2020-05-18 ENCOUNTER — Ambulatory Visit: Payer: Medicare Other | Admitting: Obstetrics & Gynecology

## 2020-05-18 VITALS — BP 166/89 | HR 68 | Ht 60.0 in | Wt 148.0 lb

## 2020-05-18 DIAGNOSIS — N993 Prolapse of vaginal vault after hysterectomy: Secondary | ICD-10-CM | POA: Diagnosis not present

## 2020-05-18 DIAGNOSIS — Z4689 Encounter for fitting and adjustment of other specified devices: Secondary | ICD-10-CM | POA: Diagnosis not present

## 2020-05-18 NOTE — Progress Notes (Signed)
Chief Complaint  Patient presents with  . Pessary Check    Blood pressure (!) 166/89, pulse 68, height 5' (1.524 m), weight 148 lb (67.1 kg).  Shelia Pierce presents today for routine follow up related to her pessary.   She uses a Milex ring with support #5 She reports no vaginal discharge and no vaginal bleeding   Likert scale(1 not bothersome -5 very bothersome)  :  1  Exam reveals no undue vaginal mucosal pressure of breakdown, no discharge and no vaginal bleeding.  Vaginal Epithelial Abnormality Classification System:   0 0    No abnormalities 1    Epithelial erythema 2    Granulation tissue 3    Epithelial break or erosion, 1 cm or less 4    Epithelial break or erosion, 1 cm or greater  The pessary is removed, cleaned and replaced without difficulty.    No diagnosis found.   Shelia Pierce will be sen back in 4 months for continued follow up.  Florian Buff, MD  05/18/2020 4:30 PM

## 2020-05-31 ENCOUNTER — Encounter: Payer: Self-pay | Admitting: Orthopedic Surgery

## 2020-05-31 ENCOUNTER — Other Ambulatory Visit: Payer: Self-pay

## 2020-05-31 ENCOUNTER — Ambulatory Visit: Payer: Medicare Other

## 2020-05-31 ENCOUNTER — Ambulatory Visit: Payer: Medicare Other | Admitting: Orthopedic Surgery

## 2020-05-31 VITALS — BP 176/102 | HR 78 | Ht 60.0 in | Wt 145.0 lb

## 2020-05-31 DIAGNOSIS — M25562 Pain in left knee: Secondary | ICD-10-CM

## 2020-05-31 DIAGNOSIS — G8929 Other chronic pain: Secondary | ICD-10-CM

## 2020-05-31 DIAGNOSIS — M1712 Unilateral primary osteoarthritis, left knee: Secondary | ICD-10-CM

## 2020-05-31 MED ORDER — MELOXICAM 7.5 MG PO TABS: 7.5000 mg | ORAL_TABLET | Freq: Every day | ORAL | 5 refills | Status: DC

## 2020-05-31 NOTE — Progress Notes (Signed)
NEW PROBLEM//OFFICE VISIT  Summary assessment and plan: Nonoperative treatment Injection left knee Celestone Mobic Return in 4 to 6 weeks  Chief Complaint  Patient presents with  . Knee Pain    Left for 3 years     80 year old female presents with a 3-year history of left knee pain which started when she was getting out of a seated position and her leg gave way she had an x-ray which showed no fracture she had a cortisone shot which seemed to give her relief for about 3 years.    Over the last year she has noticed lateral knee pain she is tried some Tylenol and ibuprofen without good relief.  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   Musculoskeletal: Positive for back pain and joint pain.     Past Medical History:  Diagnosis Date  . Colon polyps   . Complication of anesthesia   . Diabetes mellitus without complication ([place])    Diet controlled  . Hypercholesteremia   . Hypertension   . PONV (postoperative nausea and vomiting)     Past Surgical History:  Procedure Laterality Date  . ABDOMINAL HYSTERECTOMY    . CATARACT EXTRACTION W/PHACO Left 10/18/2018   Procedure: CATARACT EXTRACTION PHACO AND INTRAOCULAR LENS PLACEMENT  LEFT EYE (CDE: 22.04);  Surgeon: [name], MD;  Location: [facility];  Service: Ophthalmology;  Laterality: Left;  . CATARACT EXTRACTION W/PHACO Right 11/15/2018   Procedure: CATARACT EXTRACTION PHACO AND INTRAOCULAR LENS PLACEMENT (IOC) (CDE: 23.55);  Surgeon: [name], MD;  Location: [facility];  Service: Ophthalmology;  Laterality: Right;  . COLONOSCOPY    . COLONOSCOPY N/A 11/21/2012   Procedure:  COLONOSCOPY;  Surgeon: Rogene Houston, MD;  [facility];  Service: Endoscopy;  Laterality: N/A;  1030  . COLONOSCOPY N/A 05/25/2017   Procedure: COLONOSCOPY;  Surgeon: Rogene Houston, MD;  [facility];  Service: Endoscopy;  Laterality: N/A;  830  . Left breast biopsy    . Left foot      Family History  Problem Relation Age of Onset  . Other Mother        bladder infection  . Colon cancer Neg Hx    Social History   Tobacco Use  . Smoking status: Never Smoker  . Smokeless tobacco: Never Used  Vaping Use  . Vaping Use: Never used  Substance Use Topics  . Alcohol use: No  . Drug use: No    No Known Allergies  Current Meds  Medication Sig  . alendronate (FOSAMAX) 70 MG tablet Take 70 mg by mouth once a week.  . ARTIFICIAL TEAR OP Place 1-2 drops into both eyes daily as needed (for dry eyes).   . AZO-CRANBERRY PO Take by mouth.  . Calcium Carb-Cholecalciferol (CALCIUM 600+D) 600-800 MG-UNIT TABS Take 2 tablets by mouth at bedtime.  . Cholecalciferol (VITAMIN D3 PO) Take by mouth.  . Cyanocobalamin (B-12) 2500 MCG TABS Take 2,500 mcg by mouth at bedtime.  Mariane Baumgarten Sodium (COLACE PO) Take by mouth.  . hydrALAZINE (APRESOLINE) 25 MG tablet Take 25 mg by mouth 3 (three) times daily.  Marland Kitchen  ibuprofen (ADVIL) 200 MG tablet Take 400 mg by mouth every 6 (six) hours as needed for headache or moderate pain.  Marland Kitchen lidocaine (XYLOCAINE) 5 % ointment Apply 1 application topically 3 (three) times daily as needed for moderate pain.  Marland Kitchen lisinopril (ZESTRIL) 20 MG tablet Take 1 tablet by mouth daily.  Marland Kitchen MAGNESIUM PO Take by mouth.  . Multiple Vitamin (MULTIVITAMIN WITH MINERALS) TABS tablet Take 1 tablet by mouth at bedtime.  . Omega-3 Fatty Acids (FISH OIL) 1200 MG CAPS Take 1,200 mg by mouth at bedtime.  Marland Kitchen Phenylephrine-Cocoa Butter (GOODSENSE HEMORRHOIDAL RE) Place 1 application rectally daily as needed (hemorrhoids).  Marland Kitchen tiZANidine (ZANAFLEX) 4 MG tablet Take 4 mg by mouth  4 (four) times daily as needed.  . vitamin C (ASCORBIC ACID) 500 MG tablet Take 1,000 mg by mouth at bedtime.  . [DISCONTINUED] HYDROcodone-acetaminophen (NORCO/VICODIN) 5-325 MG tablet Take 1-2 tablets by mouth 4 (four) times daily as needed.  . [DISCONTINUED] naproxen sodium (ALEVE) 220 MG tablet Take 220 mg by mouth daily as needed (pain).  . [DISCONTINUED] TURMERIC PO Take by mouth.    BP (!) 176/102   Pulse 78   Ht 5' (1.524 m)   Wt 145 lb (65.8 kg)   BMI 28.32 kg/m   Physical Exam  General appearance: Well-developed well-nourished no gross deformities  Cardiovascular normal pulse and perfusion normal color without edema  Neurologically o sensation loss or deficits or pathologic reflexes  Psychological: Awake alert and oriented x3 mood and affect normal  Skin no lacerations or ulcerations no nodularity no palpable masses, no erythema or nodularity  Musculoskeletal:  She has a antalgic gait she has a valgus knee she has 120 degrees of knee flexion she has a stable knee she has lateral joint line tenderness no effusion     MEDICAL DECISION MAKING  A.  Encounter Diagnosis  Name Primary?  . Chronic pain of left knee Yes    B. 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 MANAGEMENT   Procedure inject left knee.  Through a lateral approach we injected left knee with Celestone and lidocaine 2 cc of lidocaine 6 mg Celestone  Consent was given and site was confirmed.  No orders of the defined types were placed in this encounter.     Arther Abbott, MD  05/31/2020 3:40 PM

## 2020-05-31 NOTE — Patient Instructions (Signed)

## 2020-06-10 ENCOUNTER — Other Ambulatory Visit: Payer: Self-pay

## 2020-06-10 ENCOUNTER — Ambulatory Visit: Payer: Medicare Other | Admitting: General Surgery

## 2020-06-10 ENCOUNTER — Encounter: Payer: Self-pay | Admitting: General Surgery

## 2020-06-10 VITALS — BP 169/106 | HR 73 | Temp 98.6°F | Resp 14 | Ht 60.0 in | Wt 147.0 lb

## 2020-06-10 DIAGNOSIS — K429 Umbilical hernia without obstruction or gangrene: Secondary | ICD-10-CM | POA: Diagnosis not present

## 2020-06-10 NOTE — Progress Notes (Signed)
[facility] History and Physical  Reason for Referral: Umbilical hernia  Referring Physician:  Redmond School, MD   Chief Complaint    New Patient (Initial Visit)      Shelia Pierce is a 80 y.o. female.  HPI:  Shelia Pierce is as 80 yo with HTN, DM who has noticed a growing bulge that has started to cause her pain in the last several weeks. She says that when she bends over or wears her pants along her umbilicus she has pain.  The pain is sharp in nature and constant.  She has some associated nausea at times. She had a recent UTI and a CT was done to rule out kidney stones and she was noted to have the umbilical hernia with fat on this study and a hiatal hernia.   She says she has some minor reflux at times but nothing major. She belches some.   Past Medical History:  Diagnosis Date  . Colon polyps   . Complication of anesthesia   . Diabetes mellitus without complication ([place])    Diet controlled  . Hypercholesteremia   . Hypertension   . PONV (postoperative nausea and vomiting)     Past Surgical History:  Procedure Laterality Date  . ABDOMINAL HYSTERECTOMY    . CATARACT EXTRACTION W/PHACO Left 10/18/2018   Procedure: CATARACT EXTRACTION PHACO AND INTRAOCULAR LENS PLACEMENT  LEFT EYE (CDE: 22.04);  Surgeon: [name], MD;  Location: [facility];  Service: Ophthalmology;  Laterality: Left;  . CATARACT EXTRACTION W/PHACO Right 11/15/2018   Procedure: CATARACT EXTRACTION PHACO AND INTRAOCULAR LENS PLACEMENT (IOC) (CDE: 23.55);  Surgeon: [name], MD;  Location: [facility];  Service: Ophthalmology;  Laterality: Right;  . COLONOSCOPY    . COLONOSCOPY N/A 11/21/2012   Procedure: COLONOSCOPY;  Surgeon: Rogene Houston, MD;  Location: [facility];  Service: Endoscopy;  Laterality: N/A;  1030  . COLONOSCOPY N/A 05/25/2017   Procedure: COLONOSCOPY;  Surgeon: Rogene Houston, MD;  Location: [facility];  Service: Endoscopy;  Laterality: N/A;  830  . Left breast biopsy     . Left foot      Family History  Problem Relation Age of Onset  . Other Mother        bladder infection  . Colon cancer Neg Hx     Social History   Tobacco Use  . Smoking status: Never Smoker  . Smokeless tobacco: Never Used  Vaping Use  . Vaping Use: Never used  Substance Use Topics  . Alcohol use: No  . Drug use: No    Medications: I have reviewed the patient's current medications. Allergies as of 06/10/2020   No Known Allergies     Medication List       Accurate as of Jun 10, 2020  3:40 PM. If you have any questions, ask your nurse or doctor.        STOP taking these medications   ARTIFICIAL TEAR OP Stopped by: Virl Cagey, MD   AZO-CRANBERRY PO Stopped by: Virl Cagey, MD   COLACE PO Stopped by: Virl Cagey, MD   [place] by: Virl Cagey, MD   ibuprofen 200 MG tablet Commonly known as: ADVIL Stopped by: Virl Cagey, MD   lidocaine 5 % ointment Commonly known as: XYLOCAINE Stopped by: Virl Cagey, MD   lisinopril 10 MG tablet Commonly known as: ZESTRIL Stopped by: Virl Cagey, MD   lisinopril 20 MG tablet  Commonly known as: ZESTRIL Stopped by: Virl Cagey, MD   MAGNESIUM PO Stopped by: Virl Cagey, MD   tiZANidine 4 MG tablet Commonly known as: ZANAFLEX Stopped by: Virl Cagey, MD     TAKE these medications   alendronate 70 MG tablet Commonly known as: FOSAMAX Take 70 mg by mouth once a week.   B-12 2500 MCG Tabs Take 2,500 mcg by mouth at bedtime.   Calcium 600+D 600-800 MG-UNIT Tabs Generic drug: Calcium Carb-Cholecalciferol Take 2 tablets by mouth at bedtime.   Fish Oil 1200 MG Caps Take 1,200 mg by mouth at bedtime.   hydrALAZINE 25 MG tablet Commonly known as: APRESOLINE Take 25 mg by mouth 3 (three) times daily.   meloxicam 7.5 MG tablet Commonly known as: Mobic Take 1 tablet (7.5 mg total) by mouth daily.   multivitamin with  minerals Tabs tablet Take 1 tablet by mouth at bedtime.   vitamin C 500 MG tablet Commonly known as: ASCORBIC ACID Take 1,000 mg by mouth at bedtime.   VITAMIN D3 PO Take by mouth.        ROS:  A comprehensive review of systems was negative except for: Gastrointestinal: positive for abdominal pain Musculoskeletal: positive for back pain and joint pain Endocrine: positive for diabetes  Blood pressure (!) 169/106, pulse 73, temperature 98.6 F (37 C), temperature source Other (Comment), resp. rate 14, height 5' (1.524 m), weight 147 lb (66.7 kg), SpO2 95 %. Physical Exam Vitals reviewed.  HENT:     Head: Normocephalic.     Nose: Nose normal.  Eyes:     Extraocular Movements: Extraocular movements intact.  Cardiovascular:     Rate and Rhythm: Normal rate.  Pulmonary:     Effort: Pulmonary effort is normal.  Abdominal:     General: There is no distension.     Palpations: Abdomen is soft.     Tenderness: There is abdominal tenderness.     Hernia: A hernia is present. Hernia is present in the umbilical area.     Comments: Partially reducible  Musculoskeletal:        General: Normal range of motion.     Cervical back: Normal range of motion.  Skin:    General: Skin is warm.  Neurological:     General: No focal deficit present.     Mental Status: She is alert and oriented to person, place, and time.  Psychiatric:        Mood and Affect: Mood normal.        Behavior: Behavior normal.        Thought Content: Thought content normal.        Judgment: Judgment normal.     Results: Personally reviewed 2cm fascia defect with fat  CLINICAL DATA:  80 year old female with right side pain. Right hip pain for 4 days. No known injury.  EXAM: CT ABDOMEN AND PELVIS WITHOUT CONTRAST  TECHNIQUE: Multidetector CT imaging of the abdomen and pelvis was performed following the standard protocol without IV contrast.  COMPARISON:  None.  FINDINGS: Lower chest: Moderate size  gastric hiatal hernia, eccentric to the left at the lung bases. No superimposed cardiomegaly. Atelectasis in the left lower lobe. No pericardial or pleural effusion.  Hepatobiliary: Negative noncontrast liver and gallbladder, small calcified granuloma in the hepatic dome.  Pancreas: Negative.  Spleen: Negative.  Small calcified granuloma.  Adrenals/Urinary Tract: Normal adrenal glands. Nonobstructed kidneys appear symmetric with no nephrolithiasis. Both ureters are decompressed and negative. Unremarkable urinary  bladder.  Stomach/Bowel: Negative rectum. Redundant sigmoid colon with extensive diverticulosis from the splenic flexure through the sigmoid. No active inflammation identified. Decompressed large bowel aside from mild retained stool in the right colon. Diminutive appendix. No large bowel inflammation. Decompressed terminal ileum. No dilated small bowel.  Partially intrathoracic stomach. Distal stomach and proximal duodenum are negative. There is a 3.5 cm diverticulum of the distal duodenum with no active inflammation (coronal image 36).  No free air, free fluid, mesenteric inflammation. Small fat containing umbilical hernia is without inflammation on sagittal image 77.  Vascular/Lymphatic: Aortoiliac calcified atherosclerosis. Calcified proximal femoral artery atherosclerosis. Vascular patency is not evaluated in the absence of IV contrast. No lymphadenopathy.  Reproductive: Pessary in place. Uterus appear surgically absent. Normal ovaries.  Other: No pelvic free fluid.  Musculoskeletal: Osteopenia with moderate levoconvex lumbar scoliosis. Mild dextroconvex visible thoracic scoliosis. Chronic degeneration in the spine. L3-L4 spinal and right lateral recess stenosis. No acute osseous abnormality identified.  IMPRESSION: 1. No acute or inflammatory process identified in the non-contrast abdomen or pelvis. 2. No urinary calculus. Moderate size gastric  hiatal hernia. Diverticulosis of small and large bowel without active inflammation. Small fat containing umbilical hernia. 3. Aortic Atherosclerosis (ICD10-I70.0). 4. Spinal scoliosis and degeneration.   Electronically Signed   By: Genevie Ann M.D.   On: 04/15/2020 11:53  Assessment & Plan:  HAZELYNN MCKENNY is a 80 y.o. female with an umbilical hernia that is symptomatic. Plans for umbilical hernia repair with mesh.  -Discussed risk of bleeding, infection, use of mesh, recurrence, injury to bowel and primary repair.  -Discussed preop  COVID testing potential.   All questions were answered to the satisfaction of the patient.    Virl Cagey 06/10/2020, 3:40 PM

## 2020-06-10 NOTE — Patient Instructions (Signed)
Umbilical Hernia, Adult  A hernia is a bulge of tissue that pushes through an opening between muscles. An umbilical hernia happens in the abdomen, near the belly button (umbilicus). The hernia may contain tissues from the small intestine, large intestine, or fatty tissue covering the intestines (omentum). Umbilical hernias in adults tend to get worse over time, and they require surgical treatment. There are several types of umbilical hernias. You may have:  A hernia located just above or below the umbilicus (indirect hernia). This is the most common type of umbilical hernia in adults.  A hernia that forms through an opening formed by the umbilicus (direct hernia).  A hernia that comes and goes (reducible hernia). A reducible hernia may be visible only when you strain, lift something heavy, or cough. This type of hernia can be pushed back into the abdomen (reduced).  A hernia that traps abdominal tissue inside the hernia (incarcerated hernia). This type of hernia cannot be reduced.  A hernia that cuts off blood flow to the tissues inside the hernia (strangulated hernia). The tissues can start to die if this happens. This type of hernia requires emergency treatment. What are the causes? An umbilical hernia happens when tissue inside the abdomen presses on a weak area of the abdominal muscles. What increases the risk? You may have a greater risk of this condition if you:  Are obese.  Have had several pregnancies.  Have a buildup of fluid inside your abdomen (ascites).  Have had surgery that weakens the abdominal muscles. What are the signs or symptoms? The main symptom of this condition is a painless bulge at or near the belly button. A reducible hernia may be visible only when you strain, lift something heavy, or cough. Other symptoms may include:  Dull pain.  A feeling of pressure. Symptoms of a strangulated hernia may include:  Pain that gets increasingly worse.  Nausea and  vomiting.  Pain when pressing on the hernia.  Skin over the hernia becoming red or purple.  Constipation.  Blood in the stool. How is this diagnosed? This condition may be diagnosed based on:  A physical exam. You may be asked to cough or strain while standing. These actions increase the pressure inside your abdomen and force the hernia through the opening in your muscles. Your health care provider may try to reduce the hernia by pressing on it.  Your symptoms and medical history. How is this treated? Surgery is the only treatment for an umbilical hernia. Surgery for a strangulated hernia is done as soon as possible. If you have a small hernia that is not incarcerated, you may need to lose weight before having surgery. Follow these instructions at home:  Lose weight, if told by your health care provider.  Do not try to push the hernia back in.  Watch your hernia for any changes in color or size. Tell your health care provider if any changes occur.  You may need to avoid activities that increase pressure on your hernia.  Do not lift anything that is heavier than 10 lb (4.5 kg) until your health care provider says that this is safe.  Take over-the-counter and prescription medicines only as told by your health care provider.  Keep all follow-up visits as told by your health care provider. This is important. Contact a health care provider if:  Your hernia gets larger.  Your hernia becomes painful. Get help right away if:  You develop sudden, severe pain near the area of your hernia.    You have pain as well as nausea or vomiting.  You have pain and the skin over your hernia changes color.  You develop a fever. This information is not intended to replace advice given to you by your health care provider. Make sure you discuss any questions you have with your health care provider. Document Revised: 02/14/2017 Document Reviewed: 07/03/2016 Elsevier Patient Education  2021  [place] Repair, Adult Open hernia repair is a surgical procedure to fix a hernia. A hernia occurs when an internal organ or tissue pushes through a weak spot in the muscles along the wall of the abdomen. Hernias commonly occur in the groin and around the belly button. Most hernias tend to get worse over time. Often, surgery is done to prevent the hernia from becoming bigger, uncomfortable, or an emergency. Emergency surgery may be needed if contents of the abdomen get stuck in the opening (incarcerated hernia) or if the blood supply gets cut off (strangulated hernia). In an open repair, an incision is made in the abdomen to perform the surgery. Tell a health care provider about:  Any allergies you have.  All medicines you are taking, including vitamins, herbs, eye drops, creams, and over-the-counter medicines.  Any problems you or family members have had with anesthetic medicines.  Any blood or bone disorders you have.  Any surgeries you have had.  Any medical conditions you have, including any recent cold or flu (influenza)symptoms.  Whether you are pregnant or may be pregnant. What are the risks? Generally, this is a safe procedure. However, problems may occur, including:  Long-lasting (chronic) pain.  Bleeding.  Infection.  Damage to the testicles. This can cause shrinking or swelling.  Damage to nearby structures or organs, including the bladder, blood vessels, intestines, or nerves near the hernia.  Blood clots.  Trouble passing urine.  Return of the hernia. What happens before the procedure? Medicines Ask your health care provider about:  Changing or stopping your regular medicines. This is especially important if you are taking diabetes medicines or blood thinners.  Taking medicines such as aspirin and ibuprofen. These medicines can thin your blood. Do not take these medicines unless your health care provider tells you to take them.  Taking  over-the-counter medicines, vitamins, herbs, and supplements. Surgery safety Ask your health care provider:  How your surgery site will be marked.  What steps will be taken to help prevent infection. These steps may include: ? Removing hair at the surgery site. ? Washing skin with a germ-killing soap. ? Receiving antibiotic medicine. General instructions  You may have an exam or testing, such as blood tests or imaging studies.  Do not use any products that contain nicotine or tobacco for at least 4 weeks before the procedure. These products include cigarettes, chewing tobacco, and vaping devices, such as e-cigarettes. If you need help quitting, ask your health care provider.  Let your health care provider know if you develop a cold or any infection before your surgery. If you get an infection before surgery, you may receive antibiotics to treat it.  Plan to have a responsible adult take you home from the hospital or clinic.  If you will be going home right after the procedure, plan to have a responsible adult care for you for the time you are told. This is important. What happens during the procedure?  An IV will be inserted into one of your veins.  You will be given one or more of the  following: ? A medicine to help you relax (sedative). ? A medicine to numb the area (local anesthetic). ? A medicine to make you fall asleep (general anesthetic).  Your surgeon will make an incision over the hernia.  The tissues of the hernia will be moved back into place.  The edges of the hernia may be stitched (sutured) together.  The opening in the abdominal muscles will be closed with stitches (sutures). Or, your surgeon will place a mesh patch made of artificial (synthetic) material over the opening.  The incision will be closed with sutures, skin glue, or adhesive strips.  A bandage (dressing) may be placed over the incision. The procedure may vary among health care providers and  hospitals.   What happens after the procedure?  Your blood pressure, heart rate, breathing rate, and blood oxygen level will be monitored until you leave the hospital or clinic.  You may be given medicine for pain.  If you were given a sedative during the procedure, it can affect you for several hours. Do not drive or operate machinery until your health care provider says that it is safe. Summary  Open hernia repair is a surgical procedure to fix a hernia. Hernias commonly occur in the groin and around the belly button.  Emergency surgery may be needed if contents of the abdomen get stuck in the opening (incarcerated hernia) or if the blood supply gets cut off (strangulated hernia).  In this procedure, an incision is made in the abdomen to perform the surgery.  After the procedure, you may be given medicine for pain. This information is not intended to replace advice given to you by your health care provider. Make sure you discuss any questions you have with your health care provider. Document Revised: 08/18/2019 Document Reviewed: 08/18/2019 Elsevier Patient Education  2021 [employer].

## 2020-06-11 NOTE — H&P (Signed)
[facility] History and Physical  Reason for Referral: Umbilical hernia  Referring Physician:  Redmond School, MD   Chief Complaint    New Patient (Initial Visit)      Shelia Pierce is a 80 y.o. female.  HPI:  Shelia Pierce is as 80 yo with HTN, DM who has noticed a growing bulge that has started to cause her pain in the last several weeks. She says that when she bends over or wears her pants along her umbilicus she has pain.  The pain is sharp in nature and constant.  She has some associated nausea at times. She had a recent UTI and a CT was done to rule out kidney stones and she was noted to have the umbilical hernia with fat on this study and a hiatal hernia.   She says she has some minor reflux at times but nothing major. She belches some.   Past Medical History:  Diagnosis Date  . Colon polyps   . Complication of anesthesia   . Diabetes mellitus without complication ([place])    Diet controlled  . Hypercholesteremia   . Hypertension   . PONV (postoperative nausea and vomiting)     Past Surgical History:  Procedure Laterality Date  . ABDOMINAL HYSTERECTOMY    . CATARACT EXTRACTION W/PHACO Left 10/18/2018   Procedure: CATARACT EXTRACTION PHACO AND INTRAOCULAR LENS PLACEMENT  LEFT EYE (CDE: 22.04);  Surgeon: [name], MD;  Location: [facility];  Service: Ophthalmology;  Laterality: Left;  . CATARACT EXTRACTION W/PHACO Right 11/15/2018   Procedure: CATARACT EXTRACTION PHACO AND INTRAOCULAR LENS PLACEMENT (IOC) (CDE: 23.55);  Surgeon: [name], MD;  Location: [facility];  Service: Ophthalmology;  Laterality: Right;  . COLONOSCOPY    . COLONOSCOPY N/A 11/21/2012   Procedure: COLONOSCOPY;  Surgeon: Rogene Houston, MD;  Location: [facility];  Service: Endoscopy;  Laterality: N/A;  1030  . COLONOSCOPY N/A 05/25/2017   Procedure: COLONOSCOPY;  Surgeon: Rogene Houston, MD;  Location: [facility];  Service: Endoscopy;  Laterality: N/A;  830  . Left breast biopsy     . Left foot      Family History  Problem Relation Age of Onset  . Other Mother        bladder infection  . Colon cancer Neg Hx     Social History   Tobacco Use  . Smoking status: Never Smoker  . Smokeless tobacco: Never Used  Vaping Use  . Vaping Use: Never used  Substance Use Topics  . Alcohol use: No  . Drug use: No    Medications: I have reviewed the patient's current medications. Allergies as of 06/10/2020   No Known Allergies     Medication List       Accurate as of Jun 10, 2020  3:40 PM. If you have any questions, ask your nurse or doctor.        STOP taking these medications   ARTIFICIAL TEAR OP Stopped by: Virl Cagey, MD   AZO-CRANBERRY PO Stopped by: Virl Cagey, MD   COLACE PO Stopped by: Virl Cagey, MD   [place] by: Virl Cagey, MD   ibuprofen 200 MG tablet Commonly known as: ADVIL Stopped by: Virl Cagey, MD   lidocaine 5 % ointment Commonly known as: XYLOCAINE Stopped by: Virl Cagey, MD   lisinopril 10 MG tablet Commonly known as: ZESTRIL Stopped by: Virl Cagey, MD   lisinopril 20 MG tablet  Commonly known as: ZESTRIL Stopped by: Virl Cagey, MD   MAGNESIUM PO Stopped by: Virl Cagey, MD   tiZANidine 4 MG tablet Commonly known as: ZANAFLEX Stopped by: Virl Cagey, MD     TAKE these medications   alendronate 70 MG tablet Commonly known as: FOSAMAX Take 70 mg by mouth once a week.   B-12 2500 MCG Tabs Take 2,500 mcg by mouth at bedtime.   Calcium 600+D 600-800 MG-UNIT Tabs Generic drug: Calcium Carb-Cholecalciferol Take 2 tablets by mouth at bedtime.   Fish Oil 1200 MG Caps Take 1,200 mg by mouth at bedtime.   hydrALAZINE 25 MG tablet Commonly known as: APRESOLINE Take 25 mg by mouth 3 (three) times daily.   meloxicam 7.5 MG tablet Commonly known as: Mobic Take 1 tablet (7.5 mg total) by mouth daily.   multivitamin with  minerals Tabs tablet Take 1 tablet by mouth at bedtime.   vitamin C 500 MG tablet Commonly known as: ASCORBIC ACID Take 1,000 mg by mouth at bedtime.   VITAMIN D3 PO Take by mouth.        ROS:  A comprehensive review of systems was negative except for: Gastrointestinal: positive for abdominal pain Musculoskeletal: positive for back pain and joint pain Endocrine: positive for diabetes  Blood pressure (!) 169/106, pulse 73, temperature 98.6 F (37 C), temperature source Other (Comment), resp. rate 14, height 5' (1.524 m), weight 147 lb (66.7 kg), SpO2 95 %. Physical Exam Vitals reviewed.  HENT:     Head: Normocephalic.     Nose: Nose normal.  Eyes:     Extraocular Movements: Extraocular movements intact.  Cardiovascular:     Rate and Rhythm: Normal rate.  Pulmonary:     Effort: Pulmonary effort is normal.  Abdominal:     General: There is no distension.     Palpations: Abdomen is soft.     Tenderness: There is abdominal tenderness.     Hernia: A hernia is present. Hernia is present in the umbilical area.     Comments: Partially reducible  Musculoskeletal:        General: Normal range of motion.     Cervical back: Normal range of motion.  Skin:    General: Skin is warm.  Neurological:     General: No focal deficit present.     Mental Status: She is alert and oriented to person, place, and time.  Psychiatric:        Mood and Affect: Mood normal.        Behavior: Behavior normal.        Thought Content: Thought content normal.        Judgment: Judgment normal.     Results: Personally reviewed 2cm fascia defect with fat  CLINICAL DATA:  80 year old female with right side pain. Right hip pain for 4 days. No known injury.  EXAM: CT ABDOMEN AND PELVIS WITHOUT CONTRAST  TECHNIQUE: Multidetector CT imaging of the abdomen and pelvis was performed following the standard protocol without IV contrast.  COMPARISON:  None.  FINDINGS: Lower chest: Moderate size  gastric hiatal hernia, eccentric to the left at the lung bases. No superimposed cardiomegaly. Atelectasis in the left lower lobe. No pericardial or pleural effusion.  Hepatobiliary: Negative noncontrast liver and gallbladder, small calcified granuloma in the hepatic dome.  Pancreas: Negative.  Spleen: Negative.  Small calcified granuloma.  Adrenals/Urinary Tract: Normal adrenal glands. Nonobstructed kidneys appear symmetric with no nephrolithiasis. Both ureters are decompressed and negative. Unremarkable urinary  bladder.  Stomach/Bowel: Negative rectum. Redundant sigmoid colon with extensive diverticulosis from the splenic flexure through the sigmoid. No active inflammation identified. Decompressed large bowel aside from mild retained stool in the right colon. Diminutive appendix. No large bowel inflammation. Decompressed terminal ileum. No dilated small bowel.  Partially intrathoracic stomach. Distal stomach and proximal duodenum are negative. There is a 3.5 cm diverticulum of the distal duodenum with no active inflammation (coronal image 36).  No free air, free fluid, mesenteric inflammation. Small fat containing umbilical hernia is without inflammation on sagittal image 77.  Vascular/Lymphatic: Aortoiliac calcified atherosclerosis. Calcified proximal femoral artery atherosclerosis. Vascular patency is not evaluated in the absence of IV contrast. No lymphadenopathy.  Reproductive: Pessary in place. Uterus appear surgically absent. Normal ovaries.  Other: No pelvic free fluid.  Musculoskeletal: Osteopenia with moderate levoconvex lumbar scoliosis. Mild dextroconvex visible thoracic scoliosis. Chronic degeneration in the spine. L3-L4 spinal and right lateral recess stenosis. No acute osseous abnormality identified.  IMPRESSION: 1. No acute or inflammatory process identified in the non-contrast abdomen or pelvis. 2. No urinary calculus. Moderate size gastric  hiatal hernia. Diverticulosis of small and large bowel without active inflammation. Small fat containing umbilical hernia. 3. Aortic Atherosclerosis (ICD10-I70.0). 4. Spinal scoliosis and degeneration.   Electronically Signed   By: Genevie Ann M.D.   On: 04/15/2020 11:53  Assessment & Plan:  Shelia Pierce is a 80 y.o. female with an umbilical hernia that is symptomatic. Plans for umbilical hernia repair with mesh.  -Discussed risk of bleeding, infection, use of mesh, recurrence, injury to bowel and primary repair.  -Discussed preop  COVID testing potential.   All questions were answered to the satisfaction of the patient.    Virl Cagey 06/10/2020, 3:40 PM

## 2020-06-21 NOTE — Patient Instructions (Signed)
Shelia Pierce  06/21/2020     @PREFPERIOPPHARMACY @   Your procedure is scheduled on 06/25/2020.   [facility] to [name] at  [place]   Call this number if you have problems the morning of surgery:  [phone]   Remember:  Do not eat or drink after midnight.                         Take these medicines the morning of surgery with A SIP OF WATER  mobic (if needed).     Please brush your teeth.  Do not wear jewelry, make-up or nail polish.  Do not wear lotions, powders, or perfumes, or deodorant.  Do not shave 48 hours prior to surgery.  Men may shave face and neck.  Do not bring valuables to the hospital.  [facility] is not responsible for any belongings or valuables.  Contacts, dentures or bridgework may not be worn into surgery.  Leave your suitcase in the car.  After surgery it may be brought to your room.  For patients admitted to the hospital, discharge time will be determined by your treatment team.  Patients discharged the day of surgery will not be allowed to drive home and must have someone with them for 24 hours.    Special instructions:  DO NOT smoke tobacco or vape for 24 hours before your procedure.  Please read over the following fact sheets that you were given. Coughing and Deep Breathing, Surgical Site Infection Prevention, Anesthesia Post-op Instructions and Care and Recovery After Surgery       Open Hernia Repair, Adult, Care After What can I expect after the procedure? After the procedure, it is common to have:  Mild discomfort.  Slight bruising.  Mild swelling.  Pain in the belly (abdomen).  A small amount of blood from the cut from surgery (incision). Follow these instructions at home: Your doctor may give you more specific instructions. If you have problems, call your doctor. Medicines  Take over-the-counter and prescription medicines only as told by your doctor.  If told, take steps to prevent problems with pooping  (constipation). You may need to: ? Drink enough fluid to keep your pee (urine) pale yellow. ? Take medicines. You will be told what medicines to take. ? Eat foods that are high in fiber. These include beans, whole grains, and fresh fruits and vegetables. ? Limit foods that are high in fat and sugar. These include fried or sweet foods.  Ask your doctor if you should avoid driving or using machines while you are taking your medicine. Incision care  Follow instructions from your doctor about how to take care of your incision. Make sure you: ? Wash your hands with soap and water for at least 20 seconds before and after you change your bandage (dressing). If you cannot use soap and water, use hand sanitizer. ? Change your bandage. ? Leave stitches or skin glue in place for at least 2 weeks. ? Leave tape strips alone unless you are told to take them off. You may trim the edges of the tape strips if they curl up.  Check your incision every day for signs of infection. Check for: ? More redness, swelling, or pain. ? More fluid or blood. ? Warmth. ? Pus or a bad smell.  Wear loose, soft clothing while your incision heals.   Activity  Rest as told by your doctor.  Do not lift anything that is heavier than 10 lb (4.5 kg), or the limit that you are told.  Do not play contact sports until your doctor says that this is safe.  If you were given a sedative during your procedure, do not drive or use machines until your doctor says that it is safe. A sedative is a medicine that helps you relax.  Return to your normal activities when your doctor says that it is safe.   General instructions  Do not take baths, swim, or use a hot tub. Ask your doctor about taking showers or sponge baths.  Hold a pillow over your belly when you cough or sneeze. This helps with pain.  Do not smoke or use any products that contain nicotine or tobacco. If you need help quitting, ask your doctor.  Keep all follow-up  visits. Contact a doctor if:  You have any of these signs of infection in or around your incision: ? More redness, swelling, or pain. ? More fluid or blood. ? Warmth. ? Pus. ? A bad smell.  You have a fever or chills.  You have blood in your poop (stool).  You have not pooped (had a bowel movement) in 2-3 days.  Medicine does not help your pain. Get help right away if:  You have chest pain, or you are short of breath.  You feel faint or light-headed.  You have very bad pain.  You vomit and your pain is worse.  You have pain, swelling, or redness in a leg. These symptoms may be an emergency. Get help right away. Call your local emergency services (911 in the [place]  Do not wait to see if the symptoms will go away.  Do not drive yourself to the hospital. Summary  After this procedure, it is common to have mild discomfort, slight bruising, and mild swelling.  Follow instructions from your doctor about how to take care of your cut from surgery (incision). Check every day for signs of infection.  Do not lift heavy objects or play contact sports until your doctor says it is safe.  Return to your normal activities as told by your doctor. This information is not intended to replace advice given to you by your health care provider. Make sure you discuss any questions you have with your health care provider. Document Revised: 08/18/2019 Document Reviewed: 08/18/2019 Elsevier Patient Education  2021 [place] Anesthesia, Adult, Care After This sheet gives you information about how to care for yourself after your procedure. Your health care provider may also give you more specific instructions. If you have problems or questions, contact your health care provider. What can I expect after the procedure? After the procedure, the following side effects are common:  Pain or discomfort at the IV site.  Nausea.  Vomiting.  Sore throat.  Trouble  concentrating.  Feeling cold or chills.  Feeling weak or tired.  Sleepiness and fatigue.  Soreness and body aches. These side effects can affect parts of the body that were not involved in surgery. Follow these instructions at home: For the time period you were told by your health care provider:  Rest.  Do not participate in activities where you could fall or become injured.  Do not drive or use machinery.  Do not drink alcohol.  Do not take sleeping pills or medicines that cause drowsiness.  Do not make important decisions or sign legal documents.  Do not take care of children on your own.  Eating and drinking  Follow any instructions from your health care provider about eating or drinking restrictions.  When you feel hungry, start by eating small amounts of foods that are soft and easy to digest (bland), such as toast. Gradually return to your regular diet.  Drink enough fluid to keep your urine pale yellow.  If you vomit, rehydrate by drinking water, juice, or clear broth. General instructions  If you have sleep apnea, surgery and certain medicines can increase your risk for breathing problems. Follow instructions from your health care provider about wearing your sleep device: ? Anytime you are sleeping, including during daytime naps. ? While taking prescription pain medicines, sleeping medicines, or medicines that make you drowsy.  Have a responsible adult stay with you for the time you are told. It is important to have someone help care for you until you are awake and alert.  Return to your normal activities as told by your health care provider. Ask your health care provider what activities are safe for you.  Take over-the-counter and prescription medicines only as told by your health care provider.  If you smoke, do not smoke without supervision.  Keep all follow-up visits as told by your health care provider. This is important. Contact a health care provider  if:  You have nausea or vomiting that does not get better with medicine.  You cannot eat or drink without vomiting.  You have pain that does not get better with medicine.  You are unable to pass urine.  You develop a skin rash.  You have a fever.  You have redness around your IV site that gets worse. Get help right away if:  You have difficulty breathing.  You have chest pain.  You have blood in your urine or stool, or you vomit blood. Summary  After the procedure, it is common to have a sore throat or nausea. It is also common to feel tired.  Have a responsible adult stay with you for the time you are told. It is important to have someone help care for you until you are awake and alert.  When you feel hungry, start by eating small amounts of foods that are soft and easy to digest (bland), such as toast. Gradually return to your regular diet.  Drink enough fluid to keep your urine pale yellow.  Return to your normal activities as told by your health care provider. Ask your health care provider what activities are safe for you. This information is not intended to replace advice given to you by your health care provider. Make sure you discuss any questions you have with your health care provider. Document Revised: 09/18/2019 Document Reviewed: 04/17/2019 Elsevier Patient Education  2021 [place]. How to Use Chlorhexidine for Bathing Chlorhexidine gluconate (CHG) is a germ-killing (antiseptic) solution that is used to clean the skin. It can get rid of the bacteria that normally live on the skin and can keep them away for about 24 hours. To clean your skin with CHG, you may be given:  A CHG solution to use in the shower or as part of a sponge bath.  A prepackaged cloth that contains CHG. Cleaning your skin with CHG may help lower the risk for infection:  While you are staying in the intensive care unit of the hospital.  If you have a vascular access, such as a central  line, to provide short-term or long-term access to your veins.  If you have a catheter to drain urine from  your bladder.  If you are on a ventilator. A ventilator is a machine that helps you breathe by moving air in and out of your lungs.  After surgery. What are the risks? Risks of using CHG include:  A skin reaction.  Hearing loss, if CHG gets in your ears.  Eye injury, if CHG gets in your eyes and is not rinsed out.  The CHG product catching fire. Make sure that you avoid smoking and flames after applying CHG to your skin. Do not use CHG:  If you have a chlorhexidine allergy or have previously reacted to chlorhexidine.  On babies younger than 24 months of age. How to use CHG solution  Use CHG only as told by your health care provider, and follow the instructions on the label.  Use the full amount of CHG as directed. Usually, this is one bottle. During a shower Follow these steps when using CHG solution during a shower (unless your health care provider gives you different instructions): 1. Start the shower. 2. Use your normal soap and shampoo to wash your face and hair. 3. Turn off the shower or move out of the shower stream. 4. Pour the CHG onto a clean washcloth. Do not use any type of brush or rough-edged sponge. 5. Starting at your neck, lather your body down to your toes. Make sure you follow these instructions: ? If you will be having surgery, pay special attention to the part of your body where you will be having surgery. Scrub this area for at least 1 minute. ? Do not use CHG on your head or face. If the solution gets into your ears or eyes, rinse them well with water. ? Avoid your genital area. ? Avoid any areas of skin that have broken skin, cuts, or scrapes. ? Scrub your back and under your arms. Make sure to wash skin folds. 6. Let the lather sit on your skin for 1-2 minutes or as long as told by your health care provider. 7. Thoroughly rinse your entire body in  the shower. Make sure that all body creases and crevices are rinsed well. 8. Dry off with a clean towel. Do not put any substances on your body afterward--such as powder, lotion, or perfume--unless you are told to do so by your health care provider. Only use lotions that are recommended by the manufacturer. 9. Put on clean clothes or pajamas. 10. If it is the night before your surgery, sleep in clean sheets.   During a sponge bath Follow these steps when using CHG solution during a sponge bath (unless your health care provider gives you different instructions): 1. Use your normal soap and shampoo to wash your face and hair. 2. Pour the CHG onto a clean washcloth. 3. Starting at your neck, lather your body down to your toes. Make sure you follow these instructions: ? If you will be having surgery, pay special attention to the part of your body where you will be having surgery. Scrub this area for at least 1 minute. ? Do not use CHG on your head or face. If the solution gets into your ears or eyes, rinse them well with water. ? Avoid your genital area. ? Avoid any areas of skin that have broken skin, cuts, or scrapes. ? Scrub your back and under your arms. Make sure to wash skin folds. 4. Let the lather sit on your skin for 1-2 minutes or as long as told by your health care provider. 5. Using a  different clean, wet washcloth, thoroughly rinse your entire body. Make sure that all body creases and crevices are rinsed well. 6. Dry off with a clean towel. Do not put any substances on your body afterward--such as powder, lotion, or perfume--unless you are told to do so by your health care provider. Only use lotions that are recommended by the manufacturer. 7. Put on clean clothes or pajamas. 8. If it is the night before your surgery, sleep in clean sheets. How to use CHG prepackaged cloths  Only use CHG cloths as told by your health care provider, and follow the instructions on the label.  Use the  CHG cloth on clean, dry skin.  Do not use the CHG cloth on your head or face unless your health care provider tells you to.  When washing with the CHG cloth: ? Avoid your genital area. ? Avoid any areas of skin that have broken skin, cuts, or scrapes. Before surgery Follow these steps when using a CHG cloth to clean before surgery (unless your health care provider gives you different instructions): 1. Using the CHG cloth, vigorously scrub the part of your body where you will be having surgery. Scrub using a back-and-forth motion for 3 minutes. The area on your body should be completely wet with CHG when you are done scrubbing. 2. Do not rinse. Discard the cloth and let the area air-dry. Do not put any substances on the area afterward, such as powder, lotion, or perfume. 3. Put on clean clothes or pajamas. 4. If it is the night before your surgery, sleep in clean sheets.   For general bathing Follow these steps when using CHG cloths for general bathing (unless your health care provider gives you different instructions). 1. Use a separate CHG cloth for each area of your body. Make sure you wash between any folds of skin and between your fingers and toes. Wash your body in the following order, switching to a new cloth after each step: ? The front of your neck, shoulders, and chest. ? Both of your arms, under your arms, and your hands. ? Your stomach and groin area, avoiding the genitals. ? Your right leg and foot. ? Your left leg and foot. ? The back of your neck, your back, and your buttocks. 2. Do not rinse. Discard the cloth and let the area air-dry. Do not put any substances on your body afterward--such as powder, lotion, or perfume--unless you are told to do so by your health care provider. Only use lotions that are recommended by the manufacturer. 3. Put on clean clothes or pajamas. Contact a health care provider if:  Your skin gets irritated after scrubbing.  You have questions about  using your solution or cloth. Get help right away if:  Your eyes become very red or swollen.  Your eyes itch badly.  Your skin itches badly and is red or swollen.  Your hearing changes.  You have trouble seeing.  You have swelling or tingling in your mouth or throat.  You have trouble breathing.  You swallow any chlorhexidine. Summary  Chlorhexidine gluconate (CHG) is a germ-killing (antiseptic) solution that is used to clean the skin. Cleaning your skin with CHG may help to lower your risk for infection.  You may be given CHG to use for bathing. It may be in a bottle or in a prepackaged cloth to use on your skin. Carefully follow your health care provider's instructions and the instructions on the product label.  Do not use  CHG if you have a chlorhexidine allergy.  Contact your health care provider if your skin gets irritated after scrubbing. This information is not intended to replace advice given to you by your health care provider. Make sure you discuss any questions you have with your health care provider. Document Revised: 06/20/2019 Document Reviewed: 06/20/2019 Elsevier Patient Education  [place].

## 2020-06-22 ENCOUNTER — Ambulatory Visit: Payer: Medicare Other | Admitting: Obstetrics & Gynecology

## 2020-06-22 ENCOUNTER — Other Ambulatory Visit: Payer: Self-pay

## 2020-06-22 ENCOUNTER — Encounter (HOSPITAL_COMMUNITY): Payer: Self-pay

## 2020-06-22 ENCOUNTER — Encounter (HOSPITAL_COMMUNITY)
Admission: RE | Admit: 2020-06-22 | Discharge: 2020-06-22 | Disposition: A | Discharge status: Home / Self Care | Payer: Medicare Other | Source: Ambulatory Visit | Attending: General Surgery | Admitting: General Surgery

## 2020-06-22 DIAGNOSIS — Z01818 Encounter for other preprocedural examination: Secondary | ICD-10-CM | POA: Diagnosis not present

## 2020-06-22 HISTORY — DX: Age-related osteoporosis without current pathological fracture: M81.0

## 2020-06-22 HISTORY — DX: Scoliosis, unspecified: M41.9

## 2020-06-22 HISTORY — DX: Sciatica, right side: M54.31

## 2020-06-22 LAB — BASIC METABOLIC PANEL
Anion gap: 9 (ref 5–15)
BUN: 13 mg/dL (ref 8–23)
CO2: 25 mmol/L (ref 22–32)
Calcium: 8.9 mg/dL (ref 8.9–10.3)
Chloride: 104 mmol/L (ref 98–111)
Creatinine, Ser: 0.61 mg/dL (ref 0.44–1.00)
GFR, Estimated: >60 mL/min (ref >60)
Glucose, Bld: 168 mg/dL — ABNORMAL HIGH (ref 70–99)
Potassium: 3.6 mmol/L (ref 3.5–5.1)
Sodium: 138 mmol/L (ref 135–145)

## 2020-06-24 LAB — HEMOGLOBIN A1C
Hgb A1c MFr Bld: 6 % — ABNORMAL HIGH (ref 4.8–5.6)
Mean Plasma Glucose: 126 mg/dL

## 2020-06-25 ENCOUNTER — Encounter (HOSPITAL_COMMUNITY): Payer: Self-pay | Admitting: General Surgery

## 2020-06-25 ENCOUNTER — Ambulatory Visit (HOSPITAL_COMMUNITY)
Admission: RE | Admit: 2020-06-25 | Discharge: 2020-06-25 | Disposition: A | Discharge status: Home / Self Care | Payer: Medicare Other | Attending: General Surgery | Admitting: General Surgery

## 2020-06-25 ENCOUNTER — Encounter (HOSPITAL_COMMUNITY)
Admission: RE | Disposition: A | Discharge status: Home / Self Care | Payer: Self-pay | Source: Home / Self Care | Attending: General Surgery

## 2020-06-25 ENCOUNTER — Ambulatory Visit (HOSPITAL_COMMUNITY): Payer: Medicare Other | Admitting: Anesthesiology

## 2020-06-25 DIAGNOSIS — Z86010 Personal history of colonic polyps: Secondary | ICD-10-CM | POA: Insufficient documentation

## 2020-06-25 DIAGNOSIS — I10 Essential (primary) hypertension: Secondary | ICD-10-CM | POA: Insufficient documentation

## 2020-06-25 DIAGNOSIS — E7800 Pure hypercholesterolemia, unspecified: Secondary | ICD-10-CM | POA: Insufficient documentation

## 2020-06-25 DIAGNOSIS — K429 Umbilical hernia without obstruction or gangrene: Secondary | ICD-10-CM | POA: Insufficient documentation

## 2020-06-25 DIAGNOSIS — E119 Type 2 diabetes mellitus without complications: Secondary | ICD-10-CM | POA: Diagnosis not present

## 2020-06-25 DIAGNOSIS — Z90710 Acquired absence of both cervix and uterus: Secondary | ICD-10-CM | POA: Insufficient documentation

## 2020-06-25 HISTORY — PX: UMBILICAL HERNIA REPAIR: SHX196

## 2020-06-25 LAB — GLUCOSE, CAPILLARY
Glucose-Capillary: 112 mg/dL — ABNORMAL HIGH (ref 70–99)
Glucose-Capillary: 113 mg/dL — ABNORMAL HIGH (ref 70–99)

## 2020-06-25 SURGERY — REPAIR, HERNIA, UMBILICAL, ADULT
Anesthesia: General | Site: Abdomen

## 2020-06-25 MED ORDER — SUGAMMADEX SODIUM 500 MG/5ML IV SOLN
INTRAVENOUS | Status: AC
  Filled 2020-06-25: qty 5

## 2020-06-25 MED ORDER — SODIUM CHLORIDE 0.9 % IR SOLN
Status: DC | PRN
  Administered 2020-06-25: 1000 mL

## 2020-06-25 MED ORDER — ONDANSETRON HCL 4 MG/2ML IJ SOLN
4.0000 mg | Freq: Once | INTRAMUSCULAR | Status: AC
  Administered 2020-06-25: 4 mg via INTRAVENOUS

## 2020-06-25 MED ORDER — OXYCODONE HCL 5 MG PO TABS: 5.0000 mg | ORAL_TABLET | ORAL | 0 refills | Status: DC | PRN

## 2020-06-25 MED ORDER — ONDANSETRON HCL 4 MG/2ML IJ SOLN
INTRAMUSCULAR | Status: AC
  Filled 2020-06-25: qty 2

## 2020-06-25 MED ORDER — ONDANSETRON HCL 4 MG PO TABS: 4.0000 mg | ORAL_TABLET | Freq: Three times a day (TID) | ORAL | 1 refills | Status: DC | PRN

## 2020-06-25 MED ORDER — KETOROLAC TROMETHAMINE 15 MG/ML IJ SOLN
INTRAMUSCULAR | Status: AC
  Filled 2020-06-25: qty 1

## 2020-06-25 MED ORDER — DEXAMETHASONE SODIUM PHOSPHATE 10 MG/ML IJ SOLN
INTRAMUSCULAR | Status: AC
  Filled 2020-06-25: qty 1

## 2020-06-25 MED ORDER — ORAL CARE MOUTH RINSE: 15.0000 mL | Freq: Once | OROMUCOSAL | Status: AC

## 2020-06-25 MED ORDER — LACTATED RINGERS IV SOLN
INTRAVENOUS | Status: DC
  Administered 2020-06-25: 1000 mL via INTRAVENOUS

## 2020-06-25 MED ORDER — FENTANYL CITRATE (PF) 100 MCG/2ML IJ SOLN
INTRAMUSCULAR | Status: AC
  Filled 2020-06-25: qty 2

## 2020-06-25 MED ORDER — EPHEDRINE SULFATE 50 MG/ML IJ SOLN
INTRAMUSCULAR | Status: DC | PRN
  Administered 2020-06-25 (×2): 5 mg via INTRAVENOUS

## 2020-06-25 MED ORDER — PROPOFOL 10 MG/ML IV BOLUS
INTRAVENOUS | Status: AC
  Filled 2020-06-25: qty 40

## 2020-06-25 MED ORDER — MIDAZOLAM HCL 2 MG/2ML IJ SOLN
INTRAMUSCULAR | Status: AC
  Filled 2020-06-25: qty 2

## 2020-06-25 MED ORDER — CHLORHEXIDINE GLUCONATE 0.12 % MT SOLN
15.0000 mL | Freq: Once | OROMUCOSAL | Status: AC
  Administered 2020-06-25: 15 mL via OROMUCOSAL

## 2020-06-25 MED ORDER — FENTANYL CITRATE (PF) 100 MCG/2ML IJ SOLN
INTRAMUSCULAR | Status: DC | PRN
  Administered 2020-06-25: 100 ug via INTRAVENOUS

## 2020-06-25 MED ORDER — CEFAZOLIN SODIUM-DEXTROSE 2-4 GM/100ML-% IV SOLN
2.0000 g | INTRAVENOUS | Status: AC
  Administered 2020-06-25: 2 g via INTRAVENOUS

## 2020-06-25 MED ORDER — CHLORHEXIDINE GLUCONATE CLOTH 2 % EX PADS: 6.0000 | MEDICATED_PAD | Freq: Once | CUTANEOUS | Status: DC

## 2020-06-25 MED ORDER — DEXAMETHASONE SODIUM PHOSPHATE 10 MG/ML IJ SOLN
INTRAMUSCULAR | Status: DC | PRN
  Administered 2020-06-25: 5 mg via INTRAVENOUS

## 2020-06-25 MED ORDER — LIDOCAINE HCL (CARDIAC) PF 100 MG/5ML IV SOSY
PREFILLED_SYRINGE | INTRAVENOUS | Status: DC | PRN
  Administered 2020-06-25: 50 mg via INTRAVENOUS

## 2020-06-25 MED ORDER — KETOROLAC TROMETHAMINE 15 MG/ML IJ SOLN
15.0000 mg | Freq: Once | INTRAMUSCULAR | Status: AC
  Administered 2020-06-25: 15 mg via INTRAVENOUS

## 2020-06-25 MED ORDER — EPHEDRINE 5 MG/ML INJ
INTRAVENOUS | Status: AC
  Filled 2020-06-25: qty 10

## 2020-06-25 MED ORDER — CEFAZOLIN SODIUM-DEXTROSE 2-4 GM/100ML-% IV SOLN
INTRAVENOUS | Status: AC
  Filled 2020-06-25: qty 100

## 2020-06-25 MED ORDER — FENTANYL CITRATE (PF) 100 MCG/2ML IJ SOLN: 25.0000 ug | INTRAMUSCULAR | Status: DC | PRN

## 2020-06-25 MED ORDER — ACETAMINOPHEN 325 MG PO TABS
650.0000 mg | ORAL_TABLET | Freq: Once | ORAL | Status: AC
  Administered 2020-06-25: 650 mg via ORAL

## 2020-06-25 MED ORDER — ACETAMINOPHEN 325 MG PO TABS
ORAL_TABLET | ORAL | Status: AC
  Filled 2020-06-25: qty 2

## 2020-06-25 MED ORDER — SUGAMMADEX SODIUM 200 MG/2ML IV SOLN
INTRAVENOUS | Status: DC | PRN
  Administered 2020-06-25: 200 mg via INTRAVENOUS

## 2020-06-25 MED ORDER — BUPIVACAINE LIPOSOME 1.3 % IJ SUSP
INTRAMUSCULAR | Status: DC | PRN
  Administered 2020-06-25: 20 mL

## 2020-06-25 MED ORDER — ROCURONIUM BROMIDE 10 MG/ML (PF) SYRINGE
PREFILLED_SYRINGE | INTRAVENOUS | Status: AC
  Filled 2020-06-25: qty 10

## 2020-06-25 MED ORDER — ONDANSETRON HCL 4 MG/2ML IJ SOLN: 4.0000 mg | Freq: Once | INTRAMUSCULAR | Status: DC | PRN

## 2020-06-25 MED ORDER — BUPIVACAINE LIPOSOME 1.3 % IJ SUSP
INTRAMUSCULAR | Status: AC
  Filled 2020-06-25: qty 20

## 2020-06-25 MED ORDER — ROCURONIUM BROMIDE 100 MG/10ML IV SOLN
INTRAVENOUS | Status: DC | PRN
  Administered 2020-06-25: 50 mg via INTRAVENOUS

## 2020-06-25 MED ORDER — PROPOFOL 10 MG/ML IV BOLUS
INTRAVENOUS | Status: DC | PRN
  Administered 2020-06-25: 100 mg via INTRAVENOUS

## 2020-06-25 SURGICAL SUPPLY — 39 items
ADH SKN CLS APL DERMABOND .7 (GAUZE/BANDAGES/DRESSINGS) ×1
APL PRP STRL LF DISP 70% ISPRP (MISCELLANEOUS) ×1
APL SWBSTK 6 STRL LF DISP (MISCELLANEOUS) ×2
APPLICATOR COTTON TIP 6 STRL (MISCELLANEOUS) IMPLANT
APPLICATOR COTTON TIP 6IN STRL (MISCELLANEOUS) ×6
BLADE SURG 15 STRL LF DISP TIS (BLADE) ×1 IMPLANT
BLADE SURG 15 STRL SS (BLADE) ×3
CHLORAPREP W/TINT 26 (MISCELLANEOUS) ×3 IMPLANT
CLOTH BEACON ORANGE TIMEOUT ST (SAFETY) ×3 IMPLANT
COVER LIGHT HANDLE STERIS (MISCELLANEOUS) ×6 IMPLANT
COVER WAND RF STERILE (DRAPES) ×3 IMPLANT
DERMABOND ADVANCED (GAUZE/BANDAGES/DRESSINGS) ×2
DERMABOND ADVANCED .7 DNX12 (GAUZE/BANDAGES/DRESSINGS) ×1 IMPLANT
DRSG TEGADERM 2-3/8X2-3/4 SM (GAUZE/BANDAGES/DRESSINGS) ×2 IMPLANT
ELECT REM PT RETURN 9FT ADLT (ELECTROSURGICAL) ×3
ELECTRODE REM PT RTRN 9FT ADLT (ELECTROSURGICAL) ×1 IMPLANT
GAUZE SPONGE 2X2 8PLY NS (GAUZE/BANDAGES/DRESSINGS) ×2 IMPLANT
GLOVE SURG ENC MOIS LTX SZ6.5 (GLOVE) ×3 IMPLANT
GLOVE SURG UNDER POLY LF SZ6.5 (GLOVE) ×3 IMPLANT
GLOVE SURG UNDER POLY LF SZ7 (GLOVE) ×3 IMPLANT
GOWN STRL REUS W/TWL LRG LVL3 (GOWN DISPOSABLE) ×6 IMPLANT
INST SET MINOR GENERAL (KITS) ×3 IMPLANT
KIT TURNOVER KIT A (KITS) ×3 IMPLANT
MANIFOLD NEPTUNE II (INSTRUMENTS) ×3 IMPLANT
MESH VENTRALEX ST 2.5 CRC MED (Mesh General) ×2 IMPLANT
NDL HYPO 18GX1.5 BLUNT FILL (NEEDLE) ×1 IMPLANT
NDL HYPO 21X1.5 SAFETY (NEEDLE) ×1 IMPLANT
NEEDLE HYPO 18GX1.5 BLUNT FILL (NEEDLE) ×3 IMPLANT
NEEDLE HYPO 21X1.5 SAFETY (NEEDLE) ×3 IMPLANT
NS IRRIG 1000ML POUR BTL (IV SOLUTION) ×3 IMPLANT
PACK MINOR (CUSTOM PROCEDURE TRAY) ×3 IMPLANT
PAD ARMBOARD 7.5X6 YLW CONV (MISCELLANEOUS) ×3 IMPLANT
PENCIL SMOKE EVACUATOR (MISCELLANEOUS) ×3 IMPLANT
SET BASIN LINEN APH (SET/KITS/TRAYS/PACK) ×3 IMPLANT
SUT ETHIBOND NAB MO 7 #0 18IN (SUTURE) ×3 IMPLANT
SUT MNCRL AB 4-0 PS2 18 (SUTURE) ×3 IMPLANT
SUT VIC AB 3-0 SH 27 (SUTURE) ×3
SUT VIC AB 3-0 SH 27X BRD (SUTURE) ×1 IMPLANT
SYR 20ML LL LF (SYRINGE) ×6 IMPLANT

## 2020-06-25 NOTE — Discharge Instructions (Addendum)
Discharge Instructions Hernia:  Common Complaints: Pain at the incision site is common. This will improve with time. Take your pain medications as described below. Some nausea is common and poor appetite. The main goal is to stay hydrated the first few days after surgery.   Diet/ Activity: Diet as tolerated. You may not have an appetite, but it is important to stay hydrated. Drink 64 ounces of water a day. Your appetite will return with time.  Remove the small clear dressing and gauze after two days (48 hours). Trim the gauze off the glue that is underneath if the gauze is stuck to the glue. Shower per your regular routine daily.  Do not take hot showers. Take warm showers that are less than 10 minutes. Rest and listen to your body, but do not remain in bed all day.Walk everyday for at least 15-20 minutes.  Deep cough and move around every 1-2 hours in the first few days after surgery. Do not pick at the dermabond glue on your incision sites.  This glue film will remain in place for 1-2 weeks and will start to peel off. Do not place lotions or balms on your incision unless instructed to specifically by Dr. Constance Haw. Do not lift > 10 lbs, perform excessive bending, pushing, pulling, squatting for 6-8 weeks after surgery. Where your abdominal binder with activity as much as possible. The activity restrictions and the abdominal binder are to prevent hernia formation at your incision while you are healing.   Pain Expectations and Narcotics: -After surgery you will have pain associated with your incisions and this is normal. The pain is muscular and nerve pain, and will get better with time. -You are encouraged and expected to take non narcotic medications like tylenol and ibuprofen (when able) to treat pain as multiple modalities can aid with pain treatment. -Narcotics are only used when pain is severe or there is breakthrough pain. -You are not expected to have a pain score of 0 after surgery, as  we cannot prevent pain. A pain score of 3-4 that allows you to be functional, move, walk, and tolerate some activity is the goal. The pain will continue to improve over the days after surgery and is dependent on your surgery. -Due to [place] law, we are only able to give a certain amount of pain medication to treat post operative pain, and we only give additional narcotics on a patient by patient basis.  -For most laparoscopic surgery, studies have shown that the majority of patients only need 10-15 narcotic pills, and for open surgeries most patients only need 15-20.   -Having appropriate expectations of pain and knowledge of pain management with non narcotics is important as we do not want anyone to become addicted to narcotic pain medication.  -Using ice packs in the first 48 hours and heating pads after 48 hours, wearing an abdominal binder (when recommended), and using over the counter medications are all ways to help with pain management.   -Simple acts like meditation and mindfulness practices after surgery can also help with pain control and research has proven the benefit of these practices.  Medication: Take tylenol and ibuprofen as needed for pain control, alternating every 4-6 hours.  Example:  Tylenol '1000mg'$  @ 6am, 12noon, 6pm, 70mdnight (Do not exceed '4000mg'$  of tylenol a day). Ibuprofen '800mg'$  @ 9am, 3pm, 9pm, 3am (Do not exceed '3600mg'$  of ibuprofen a day).  Take Roxicodone for breakthrough pain every 4 hours.  Take Colace for constipation related to narcotic  pain medication. If you do not have a bowel movement in 2 days, take Miralax over the counter.  Drink plenty of water to also prevent constipation.   Contact Information: If you have questions or concerns, please call our office, [phone], Monday- Thursday 8AM-5PM and Friday 8AM-12Noon.  If it is after hours or on the weekend, please call [facility] Main Number, [phone], [phone], and ask to speak to the surgeon on call for Dr.  Constance Haw at [facility].    [facility] UNTIL Tuesday June 29, 2020. DO NOT USE NUMBING MEDICATIONS WITHOUT CONSULTING A PHYSICIAN UNTIL AFTER TUESDAY

## 2020-06-25 NOTE — Transfer of Care (Signed)
Immediate Anesthesia Transfer of Care Note  Patient: Shelia Pierce  Procedure(s) Performed: UMBILICAL HERNIA REPAIR WITH MESH (Abdomen)  Patient Location: PACU  Anesthesia Type:General  Level of Consciousness: awake, alert , oriented and patient cooperative  Airway & Oxygen Therapy: Patient Spontanous Breathing  Post-op Assessment: Report given to RN, Post -op Vital signs reviewed and stable and Patient moving all extremities X 4  Post vital signs: Reviewed and stable  Last Vitals:  Vitals Value Taken Time  BP 171/85 06/25/20 0922  Temp    Pulse 65 06/25/20 0923  Resp 20 06/25/20 0923  SpO2 96 % 06/25/20 0923  Vitals shown include unvalidated device data.  Last Pain:  Vitals:   06/25/20 0758  TempSrc: Oral  PainSc: 0-No pain      Patients Stated Pain Goal: 6 (26/94/85 4627)  Complications: No notable events documented.

## 2020-06-25 NOTE — Op Note (Signed)
[facility] Operative Note  06/25/20  Preoperative Diagnosis: Umbilical hernia    Postoperative Diagnosis: Same   Procedure(s) Performed: Umbilical hernia repair with mesh (Ventralex ST Patch 6.3cm)    Surgeon: Lanell Matar. Constance Haw, MD   Assistants: No qualified resident was available    Anesthesia: General endotracheal   Anesthesiologist: Dr. Charna Elizabeth   Specimens: None   Estimated Blood Loss: Minimal   Blood Replacement: None    Complications: None   Wound Class: Clean    Operative Indications: Shelia Pierce is a 80 yo with an umbilical hernia. We discussed repair with mesh and risk of bleeding, infection, recurrence.   Findings: 2 cm defect    Procedure: The patient was taken to the operating room and placed supine. General endotracheal anesthesia was induced. Intravenous antibiotics were administered per protocol.  The abdomen was prepared and draped in the usual sterile fashion.   The umbilical hernia was noted to be reducible and measured about 2 cm. An incision was made under the umbilicus, and carried down through the subcutaneous tissue with electrocautery.  Dissection was performed down to the level of the fascia, exposing the hernia sac.  The hernia sac was opened with care, and excess hernia sac was resected with electrocautery.  A finger was ran on the underlying peritoneum and this was clear.  A 6.3 cm Ventralex St Hernia Patch was placed and secured with 0 Ethibond sutures ensuring that it was against the peritoneal cavity.  The hernia defect was then closed with 0 Ethibond suture in an interrupted fashion over the patch.  The umbilicus was tacked to the fascia with a 3-0 Vicryl suture.   Hemostasis was confirmed. The skin was closed with a running 4-0 Monocryl suture and dermabond.  After the dermabond dried a 2X2 and tegaderm were placed over the umbilicus to act as a pressure dressing.     All counts were correct at the end of the case. The patient  was awakened from anesthesia and extubated without complication.  The patient went to the PACU in stable condition.  Shelia Labrum, MD [facility] [address] [place], [place] [address] [phone] (office)

## 2020-06-25 NOTE — Progress Notes (Signed)
[facility]  Updated husband. Abdominal binder. Rx sent to pharmacy.  Curlene Labrum, MD [facility] [address] [place], [place] [address] [phone] (office)

## 2020-06-25 NOTE — Anesthesia Preprocedure Evaluation (Signed)
Anesthesia Evaluation  Patient identified by MRN, date of birth, ID band Patient awake    Reviewed: Allergy & Precautions, NPO status , Patient's Chart, lab work & pertinent test results  History of Anesthesia Complications (+) PONV and history of anesthetic complications  Airway        Dental  (+) Dental Advisory Given   Pulmonary    Pulmonary exam normal breath sounds clear to auscultation       Cardiovascular Exercise Tolerance: Good hypertension, Pt. on medications Normal cardiovascular exam Rhythm:Regular Rate:Normal     Neuro/Psych  Neuromuscular disease negative psych ROS   GI/Hepatic negative GI ROS, Neg liver ROS,   Endo/Other  diabetes (diet controlled), Well Controlled, Type 2  Renal/GU negative Renal ROS     Musculoskeletal negative musculoskeletal ROS (+)   Abdominal   Peds  Hematology negative hematology ROS (+)   Anesthesia Other Findings   Reproductive/Obstetrics negative OB ROS                          Anesthesia Physical Anesthesia Plan  ASA: 2  Anesthesia Plan: General   Post-op Pain Management:    Induction: Intravenous  PONV Risk Score and Plan: 4 or greater and Ondansetron and Dexamethasone  Airway Management Planned: Oral ETT  Additional Equipment:   Intra-op Plan:   Post-operative Plan: Extubation in OR  Informed Consent: I have reviewed the patients History and Physical, chart, labs and discussed the procedure including the risks, benefits and alternatives for the proposed anesthesia with the patient or authorized representative who has indicated his/her understanding and acceptance.     Dental advisory given  Plan Discussed with: CRNA and Surgeon  Anesthesia Plan Comments:         Anesthesia Quick Evaluation

## 2020-06-25 NOTE — Interval H&P Note (Signed)
History and Physical Interval Note:  06/25/2020 8:26 AM  Shelia Pierce  has presented today for surgery, with the diagnosis of umbilical hernia.  The various methods of treatment have been discussed with the patient and family. After consideration of risks, benefits and other options for treatment, the patient has consented to  Procedure(s): HERNIA REPAIR UMBILICAL ADULT WITH MESH (N/A) as a surgical intervention.  The patient's history has been reviewed, patient examined, no change in status, stable for surgery.  I have reviewed the patient's chart and labs.  Questions were answered to the patient's satisfaction.     Virl Cagey

## 2020-06-25 NOTE — Anesthesia Procedure Notes (Signed)
Procedure Name: Intubation Date/Time: 06/25/2020 8:54 AM Performed by: Jonna Munro, CRNA Pre-anesthesia Checklist: Patient identified, Patient being monitored, Timeout performed, Emergency Drugs available and Suction available Patient Re-evaluated:Patient Re-evaluated prior to induction Oxygen Delivery Method: Circle System Utilized Preoxygenation: Pre-oxygenation with 100% oxygen Induction Type: IV induction Ventilation: Mask ventilation without difficulty Laryngoscope Size: Mac and 3 Grade View: Grade II Tube type: Oral Tube size: 6.5 mm Number of attempts: 1 Airway Equipment and Method: stylet Placement Confirmation: ETT inserted through vocal cords under direct vision, positive ETCO2 and breath sounds checked- equal and bilateral Secured at: 21 cm Tube secured with: Tape Dental Injury: Teeth and Oropharynx as per pre-operative assessment

## 2020-06-25 NOTE — Anesthesia Postprocedure Evaluation (Signed)
Anesthesia Post Note  Patient: OTHA RICKLES  Procedure(s) Performed: UMBILICAL HERNIA REPAIR WITH MESH (Abdomen)  Patient location during evaluation: PACU Anesthesia Type: General Level of consciousness: awake and alert and oriented Pain management: pain level controlled Vital Signs Assessment: post-procedure vital signs reviewed and stable Respiratory status: spontaneous breathing and respiratory function stable Cardiovascular status: blood pressure returned to baseline and stable Postop Assessment: no apparent nausea or vomiting Anesthetic complications: no   No notable events documented.   Last Vitals:  Vitals:   06/25/20 0930 06/25/20 0951  BP: (!) 152/85 (!) 160/98  Pulse: 70 68  Resp: 20 20  Temp:  36.6 C  SpO2: 95% 95%    Last Pain:  Vitals:   06/25/20 0951  TempSrc: Oral  PainSc: 5                  Treylan Mcclintock C Leotta Weingarten

## 2020-06-28 ENCOUNTER — Encounter (HOSPITAL_COMMUNITY): Payer: Self-pay | Admitting: General Surgery

## 2020-07-08 ENCOUNTER — Ambulatory Visit: Payer: Medicare Other | Admitting: Orthopedic Surgery

## 2020-07-29 ENCOUNTER — Encounter: Payer: Self-pay | Admitting: General Surgery

## 2020-07-29 ENCOUNTER — Other Ambulatory Visit: Payer: Self-pay

## 2020-07-29 ENCOUNTER — Ambulatory Visit (INDEPENDENT_AMBULATORY_CARE_PROVIDER_SITE_OTHER): Payer: Medicare Other | Admitting: General Surgery

## 2020-07-29 VITALS — BP 167/98 | HR 76 | Temp 98.8°F | Resp 14 | Ht 60.0 in | Wt 145.0 lb

## 2020-07-29 DIAGNOSIS — K429 Umbilical hernia without obstruction or gangrene: Secondary | ICD-10-CM

## 2020-07-29 NOTE — Patient Instructions (Signed)
No heavy lifting > 10 lbs 6 weeks after surgery.  Ok to vacuum and mop.

## 2020-07-30 NOTE — Progress Notes (Signed)
[facility] Note   HPI:  80 y.o. Female presents to clinic for post-op follow-up evaluation of umbilical hernia repair with mesh. Patient reports no issues or pain. She has some vague left upper abdominal pain randomly but this mostly happened after sitting in an uncomfortable ED chair when bringing her mom to the ED.  Review of Systems:  Left side pain All other review of systems: otherwise negative   Vital Signs:  BP (!) 167/98   Pulse 76   Temp 98.8 F (37.1 C) (Temporal)   Resp 14   Ht 5' (1.524 m)   Wt 145 lb (65.8 kg)   SpO2 94%   BMI 28.32 kg/m    Physical Exam:  Physical Exam Vitals reviewed.  Cardiovascular:     Rate and Rhythm: Normal rate.  Pulmonary:     Effort: Pulmonary effort is normal.  Abdominal:     General: There is no distension.     Palpations: Abdomen is soft.     Tenderness: There is no abdominal tenderness.     Hernia: No hernia is present.     Comments: Incision c/d/I with no erythema or drainage  Neurological:     Mental Status: She is alert.     Assessment:  80 y.o. yo Female s/p umbilical hernia repair with mesh. Doing well.  Plan:  No heavy lifting > 10 lbs 6 weeks after surgery.  Ok to vacuum and mop.  PRN follow up    Curlene Labrum, MD [facility] [address] [place],  [address] [phone] (office)

## 2020-08-12 ENCOUNTER — Encounter: Payer: Self-pay | Admitting: Orthopedic Surgery

## 2020-08-12 ENCOUNTER — Other Ambulatory Visit: Payer: Self-pay

## 2020-08-12 ENCOUNTER — Ambulatory Visit: Payer: Medicare Other | Admitting: Orthopedic Surgery

## 2020-08-12 VITALS — BP 166/103 | HR 80 | Ht 60.0 in | Wt 140.0 lb

## 2020-08-12 DIAGNOSIS — M1712 Unilateral primary osteoarthritis, left knee: Secondary | ICD-10-CM | POA: Diagnosis not present

## 2020-08-12 DIAGNOSIS — M25562 Pain in left knee: Secondary | ICD-10-CM

## 2020-08-12 DIAGNOSIS — G8929 Other chronic pain: Secondary | ICD-10-CM

## 2020-08-12 MED ORDER — MELOXICAM 7.5 MG PO TABS
7.5000 mg | ORAL_TABLET | Freq: Every day | ORAL | 5 refills | Status: AC
Start: 2020-08-12 — End: 2020-11-10

## 2020-08-12 NOTE — Progress Notes (Signed)
FOLLOW-UP OFFICE VISIT   Encounter Diagnoses  Name Primary?   Chronic pain of left knee    Primary osteoarthritis of left knee Yes    79 YO FEMALE LEFT KNEE PAIN CURRENTLY ON MOBIC S/P IA INJECTION  DOING WELL   (and prior treatment)  + EXAM FINDINGS:  VALGUS KNEE  NO TENDERNESS AND NO EFFUSION  0-120 ROM   ASSESSMENT AND PLAN CONTINUE MOBIC , IA INJ AS NEEDED F/U AS NEEDED   Meds ordered this encounter  Medications   meloxicam (MOBIC) 7.5 MG tablet    Sig: Take 1 tablet (7.5 mg total) by mouth daily.    Dispense:  90 tablet    Refill:  5

## 2020-08-20 DIAGNOSIS — I10 Essential (primary) hypertension: Secondary | ICD-10-CM | POA: Diagnosis not present

## 2020-08-20 DIAGNOSIS — E119 Type 2 diabetes mellitus without complications: Secondary | ICD-10-CM | POA: Diagnosis not present

## 2020-08-20 DIAGNOSIS — U071 COVID-19: Secondary | ICD-10-CM | POA: Diagnosis not present

## 2020-09-15 DIAGNOSIS — M810 Age-related osteoporosis without current pathological fracture: Secondary | ICD-10-CM | POA: Diagnosis not present

## 2020-09-15 DIAGNOSIS — E119 Type 2 diabetes mellitus without complications: Secondary | ICD-10-CM | POA: Diagnosis not present

## 2020-09-15 DIAGNOSIS — I10 Essential (primary) hypertension: Secondary | ICD-10-CM | POA: Diagnosis not present

## 2020-09-15 DIAGNOSIS — E7849 Other hyperlipidemia: Secondary | ICD-10-CM | POA: Diagnosis not present

## 2020-10-05 DIAGNOSIS — Z23 Encounter for immunization: Secondary | ICD-10-CM | POA: Diagnosis not present

## 2020-11-22 ENCOUNTER — Ambulatory Visit: Payer: Medicare Other | Admitting: Obstetrics & Gynecology

## 2020-11-22 ENCOUNTER — Encounter: Payer: Self-pay | Admitting: Obstetrics & Gynecology

## 2020-11-22 ENCOUNTER — Other Ambulatory Visit: Payer: Self-pay

## 2020-11-22 VITALS — BP 162/92 | HR 80 | Ht 60.0 in | Wt 147.0 lb

## 2020-11-22 DIAGNOSIS — Z4689 Encounter for fitting and adjustment of other specified devices: Secondary | ICD-10-CM | POA: Diagnosis not present

## 2020-11-22 DIAGNOSIS — N8189 Other female genital prolapse: Secondary | ICD-10-CM

## 2020-11-22 DIAGNOSIS — N993 Prolapse of vaginal vault after hysterectomy: Secondary | ICD-10-CM | POA: Diagnosis not present

## 2020-11-22 NOTE — Progress Notes (Signed)
Chief Complaint  Patient presents with   Pessary Check    Blood pressure (!) 162/92, pulse 80, height 5' (1.524 m), weight 147 lb (66.7 kg).  Shelia Pierce presents today for routine follow up related to her pessary.   She uses a Milex ring with support #5 She reports no vaginal discharge and no vaginal bleeding   Likert scale(1 not bothersome -5 very bothersome)  :  1  Exam reveals no undue vaginal mucosal pressure of breakdown, no discharge and no vaginal bleeding.  Vaginal Epithelial Abnormality Classification System:   0 0    No abnormalities 1    Epithelial erythema 2    Granulation tissue 3    Epithelial break or erosion, 1 cm or less 4    Epithelial break or erosion, 1 cm or greater  The pessary is removed, cleaned and replaced without difficulty.      ICD-10-CM   1. Pessary maintenance Milex ring with support #5  Z46.89     2. 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 Pelvic relaxation  N81.89        Shelia Pierce will be sen back in 4 months for continued follow up.  Florian Buff, MD  11/22/2020 11:48 AM

## 2020-12-23 DIAGNOSIS — E118 Type 2 diabetes mellitus with unspecified complications: Secondary | ICD-10-CM | POA: Diagnosis not present

## 2020-12-23 DIAGNOSIS — M1712 Unilateral primary osteoarthritis, left knee: Secondary | ICD-10-CM | POA: Diagnosis not present

## 2020-12-23 DIAGNOSIS — N390 Urinary tract infection, site not specified: Secondary | ICD-10-CM | POA: Diagnosis not present

## 2021-01-06 DIAGNOSIS — N390 Urinary tract infection, site not specified: Secondary | ICD-10-CM | POA: Diagnosis not present

## 2021-01-11 ENCOUNTER — Other Ambulatory Visit (HOSPITAL_COMMUNITY): Payer: Self-pay | Admitting: Internal Medicine

## 2021-01-11 DIAGNOSIS — Z1231 Encounter for screening mammogram for malignant neoplasm of breast: Secondary | ICD-10-CM

## 2021-02-15 DIAGNOSIS — M810 Age-related osteoporosis without current pathological fracture: Secondary | ICD-10-CM | POA: Diagnosis not present

## 2021-02-15 DIAGNOSIS — E782 Mixed hyperlipidemia: Secondary | ICD-10-CM | POA: Diagnosis not present

## 2021-02-15 DIAGNOSIS — E119 Type 2 diabetes mellitus without complications: Secondary | ICD-10-CM | POA: Diagnosis not present

## 2021-02-15 DIAGNOSIS — I10 Essential (primary) hypertension: Secondary | ICD-10-CM | POA: Diagnosis not present

## 2021-02-16 ENCOUNTER — Other Ambulatory Visit: Payer: Self-pay

## 2021-02-16 ENCOUNTER — Ambulatory Visit (HOSPITAL_COMMUNITY)
Admission: RE | Admit: 2021-02-16 | Discharge: 2021-02-16 | Disposition: A | Discharge status: Home / Self Care | Payer: Medicare Other | Source: Ambulatory Visit | Attending: Internal Medicine | Admitting: Internal Medicine

## 2021-02-16 DIAGNOSIS — Z1231 Encounter for screening mammogram for malignant neoplasm of breast: Secondary | ICD-10-CM | POA: Diagnosis not present

## 2021-03-22 ENCOUNTER — Other Ambulatory Visit: Payer: Self-pay

## 2021-03-22 ENCOUNTER — Ambulatory Visit: Payer: Medicare Other | Admitting: Obstetrics & Gynecology

## 2021-03-22 ENCOUNTER — Encounter: Payer: Self-pay | Admitting: Obstetrics & Gynecology

## 2021-03-22 ENCOUNTER — Ambulatory Visit (INDEPENDENT_AMBULATORY_CARE_PROVIDER_SITE_OTHER): Payer: Medicare Other | Admitting: Obstetrics & Gynecology

## 2021-03-22 VITALS — BP 182/93 | HR 69 | Ht 60.0 in | Wt 150.0 lb

## 2021-03-22 DIAGNOSIS — N993 Prolapse of vaginal vault after hysterectomy: Secondary | ICD-10-CM

## 2021-03-22 DIAGNOSIS — Z4689 Encounter for fitting and adjustment of other specified devices: Secondary | ICD-10-CM | POA: Diagnosis not present

## 2021-03-22 NOTE — Progress Notes (Signed)
Chief Complaint  ?Patient presents with  ? Pessary Check  ? ? ?Blood pressure (!) 182/93, pulse 69, height 5' (1.524 m), weight 150 lb (68 kg). ? ?Shelia Pierce presents today for routine follow up related to her pessary.   ?She uses a Milex ring with support #5 ?She reports no vaginal discharge and no vaginal bleeding  ? ?Likert scale(1 not bothersome -5 very bothersome)  :  1 ? ?Exam reveals no undue vaginal mucosal pressure of breakdown, no discharge and no vaginal bleeding. ? ?Vaginal Epithelial Abnormality Classification System:   0 ?0    No abnormalities ?1    Epithelial erythema ?2    Granulation tissue ?3    Epithelial break or erosion, 1 cm or less ?4    Epithelial break or erosion, 1 cm or greater ? ?The pessary is removed, cleaned and replaced without difficulty.   ? ?  ICD-10-CM   ?1. Pessary maintenance Milex ring with support #5  Z46.89   ?  ?2. 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  ?  ? ?Shelia Pierce will be sen back in 4 months for continued follow up. ? ?Florian Buff, MD  ?03/22/2021 ?11:28 AM ? ? ? ?

## 2021-03-25 DIAGNOSIS — M810 Age-related osteoporosis without current pathological fracture: Secondary | ICD-10-CM | POA: Diagnosis not present

## 2021-03-25 DIAGNOSIS — D1720 Benign lipomatous neoplasm of skin and subcutaneous tissue of unspecified limb: Secondary | ICD-10-CM | POA: Diagnosis not present

## 2021-03-25 DIAGNOSIS — Z0001 Encounter for general adult medical examination with abnormal findings: Secondary | ICD-10-CM | POA: Diagnosis not present

## 2021-03-25 DIAGNOSIS — E118 Type 2 diabetes mellitus with unspecified complications: Secondary | ICD-10-CM | POA: Diagnosis not present

## 2021-03-25 DIAGNOSIS — M1991 Primary osteoarthritis, unspecified site: Secondary | ICD-10-CM | POA: Diagnosis not present

## 2021-03-25 DIAGNOSIS — I10 Essential (primary) hypertension: Secondary | ICD-10-CM | POA: Diagnosis not present

## 2021-04-20 DIAGNOSIS — L820 Inflamed seborrheic keratosis: Secondary | ICD-10-CM | POA: Diagnosis not present

## 2021-04-20 DIAGNOSIS — D2239 Melanocytic nevi of other parts of face: Secondary | ICD-10-CM | POA: Diagnosis not present

## 2021-04-20 DIAGNOSIS — X32XXXA Exposure to sunlight, initial encounter: Secondary | ICD-10-CM | POA: Diagnosis not present

## 2021-04-20 DIAGNOSIS — B078 Other viral warts: Secondary | ICD-10-CM | POA: Diagnosis not present

## 2021-04-20 DIAGNOSIS — L570 Actinic keratosis: Secondary | ICD-10-CM | POA: Diagnosis not present

## 2021-05-05 DIAGNOSIS — E113292 Type 2 diabetes mellitus with mild nonproliferative diabetic retinopathy without macular edema, left eye: Secondary | ICD-10-CM | POA: Diagnosis not present

## 2021-05-30 DIAGNOSIS — N390 Urinary tract infection, site not specified: Secondary | ICD-10-CM | POA: Diagnosis not present

## 2021-05-30 DIAGNOSIS — E782 Mixed hyperlipidemia: Secondary | ICD-10-CM | POA: Diagnosis not present

## 2021-05-30 DIAGNOSIS — E118 Type 2 diabetes mellitus with unspecified complications: Secondary | ICD-10-CM | POA: Diagnosis not present

## 2021-05-30 DIAGNOSIS — M810 Age-related osteoporosis without current pathological fracture: Secondary | ICD-10-CM | POA: Diagnosis not present

## 2021-05-30 DIAGNOSIS — I10 Essential (primary) hypertension: Secondary | ICD-10-CM | POA: Diagnosis not present

## 2021-05-30 DIAGNOSIS — M1991 Primary osteoarthritis, unspecified site: Secondary | ICD-10-CM | POA: Diagnosis not present

## 2021-06-20 DIAGNOSIS — N390 Urinary tract infection, site not specified: Secondary | ICD-10-CM | POA: Diagnosis not present

## 2021-07-22 ENCOUNTER — Ambulatory Visit: Payer: Medicare Other | Admitting: Obstetrics & Gynecology

## 2021-07-22 ENCOUNTER — Encounter: Payer: Self-pay | Admitting: Obstetrics & Gynecology

## 2021-07-22 VITALS — BP 196/104 | HR 64 | Ht 64.0 in | Wt 152.2 lb

## 2021-07-22 DIAGNOSIS — I10 Essential (primary) hypertension: Secondary | ICD-10-CM | POA: Diagnosis not present

## 2021-07-22 DIAGNOSIS — N993 Prolapse of vaginal vault after hysterectomy: Secondary | ICD-10-CM | POA: Diagnosis not present

## 2021-07-22 DIAGNOSIS — R350 Frequency of micturition: Secondary | ICD-10-CM

## 2021-07-22 DIAGNOSIS — R300 Dysuria: Secondary | ICD-10-CM | POA: Diagnosis not present

## 2021-07-22 DIAGNOSIS — Z09 Encounter for follow-up examination after completed treatment for conditions other than malignant neoplasm: Secondary | ICD-10-CM | POA: Diagnosis not present

## 2021-07-22 DIAGNOSIS — Z4689 Encounter for fitting and adjustment of other specified devices: Secondary | ICD-10-CM | POA: Diagnosis not present

## 2021-07-22 LAB — POCT URINALYSIS DIPSTICK
Blood, UA: NEGATIVE
Glucose, UA: NEGATIVE
Ketones, UA: NEGATIVE
Nitrite, UA: NEGATIVE
Protein, UA: NEGATIVE

## 2021-07-22 NOTE — Addendum Note (Signed)
Addended by: Octaviano Glow on: 07/22/2021 10:37 AM   Modules accepted: Orders

## 2021-07-22 NOTE — Progress Notes (Signed)
Chief Complaint  Patient presents with   pessary maintenace    Blood pressure (!) 199/104, pulse 65, height '5\' 4"'$  (1.626 m), weight 152 lb 3.2 oz (69 kg).  Shelia Pierce presents today for routine follow up related to her pessary.   She uses a Milex ring with support #5 She reports no vaginal discharge and no vaginal bleeding   Likert scale(1 not bothersome -5 very bothersome)  :  1  Exam reveals no undue vaginal mucosal pressure of breakdown, no discharge and no vaginal bleeding.  Vaginal Epithelial Abnormality Classification System:   0 0    No abnormalities 1    Epithelial erythema 2    Granulation tissue 3    Epithelial break or erosion, 1 cm or less 4    Epithelial break or erosion, 1 cm or greater  The pessary is removed, cleaned and replaced without difficulty.      ICD-10-CM   1. Pessary maintenance Milex ring with support #5  Z46.89     2. 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 Follow-up treatment  Z09 POCT Urinalysis Dipstick    4. Primary hypertension  I10    poor control this am, encourgaed to take BP in am to adjust the hydralazine dosing as directed by PCP       Shelia Pierce will be sen back in 3 months for continued follow up.  Florian Buff, MD  07/22/2021 10:07 AM

## 2021-07-24 LAB — URINE CULTURE

## 2021-07-27 DIAGNOSIS — D2239 Melanocytic nevi of other parts of face: Secondary | ICD-10-CM | POA: Diagnosis not present

## 2021-07-27 DIAGNOSIS — D485 Neoplasm of uncertain behavior of skin: Secondary | ICD-10-CM | POA: Diagnosis not present

## 2021-07-27 DIAGNOSIS — Q859 Phakomatosis, unspecified: Secondary | ICD-10-CM | POA: Diagnosis not present

## 2021-07-27 DIAGNOSIS — L820 Inflamed seborrheic keratosis: Secondary | ICD-10-CM | POA: Diagnosis not present

## 2021-10-06 DIAGNOSIS — Z23 Encounter for immunization: Secondary | ICD-10-CM | POA: Diagnosis not present

## 2021-10-14 DIAGNOSIS — M810 Age-related osteoporosis without current pathological fracture: Secondary | ICD-10-CM | POA: Diagnosis not present

## 2021-10-14 DIAGNOSIS — I10 Essential (primary) hypertension: Secondary | ICD-10-CM | POA: Diagnosis not present

## 2021-10-14 DIAGNOSIS — E118 Type 2 diabetes mellitus with unspecified complications: Secondary | ICD-10-CM | POA: Diagnosis not present

## 2021-10-14 DIAGNOSIS — I700 Atherosclerosis of aorta: Secondary | ICD-10-CM | POA: Diagnosis not present

## 2021-10-14 DIAGNOSIS — N390 Urinary tract infection, site not specified: Secondary | ICD-10-CM | POA: Diagnosis not present

## 2021-10-15 DIAGNOSIS — I10 Essential (primary) hypertension: Secondary | ICD-10-CM | POA: Diagnosis not present

## 2021-10-15 DIAGNOSIS — E119 Type 2 diabetes mellitus without complications: Secondary | ICD-10-CM | POA: Diagnosis not present

## 2021-10-15 DIAGNOSIS — E782 Mixed hyperlipidemia: Secondary | ICD-10-CM | POA: Diagnosis not present

## 2021-10-15 DIAGNOSIS — M810 Age-related osteoporosis without current pathological fracture: Secondary | ICD-10-CM | POA: Diagnosis not present

## 2021-10-18 ENCOUNTER — Other Ambulatory Visit: Payer: Self-pay | Admitting: Orthopedic Surgery

## 2021-10-21 ENCOUNTER — Ambulatory Visit: Payer: Medicare Other | Admitting: Obstetrics & Gynecology

## 2021-10-21 ENCOUNTER — Encounter: Payer: Self-pay | Admitting: Obstetrics & Gynecology

## 2021-10-21 VITALS — BP 156/87 | HR 69 | Ht 64.0 in

## 2021-10-21 DIAGNOSIS — Z4689 Encounter for fitting and adjustment of other specified devices: Secondary | ICD-10-CM

## 2021-10-21 DIAGNOSIS — N993 Prolapse of vaginal vault after hysterectomy: Secondary | ICD-10-CM

## 2021-10-21 NOTE — Progress Notes (Signed)
Chief Complaint  Patient presents with   Pessary Maintenance    Blood pressure (!) 156/87, pulse 69, height '5\' 4"'$  (1.626 m).  Shelia Pierce presents today for routine follow up related to her pessary.   She uses a Milex ring with support #5 She reports no vaginal discharge and no vaginal bleeding   Likert scale(1 not bothersome -5 very bothersome)  :  1  Exam reveals no undue vaginal mucosal pressure of breakdown, no discharge and no vaginal bleeding.  Vaginal Epithelial Abnormality Classification System:   0 0    No abnormalities 1    Epithelial erythema 2    Granulation tissue 3    Epithelial break or erosion, 1 cm or less 4    Epithelial break or erosion, 1 cm or greater  The pessary is removed, cleaned and replaced without difficulty.      ICD-10-CM   1. Pessary maintenance Milex ring with support #5  Z46.89     2. 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  Florian Buff, MD  10/21/2021 11:01 AM

## 2021-11-22 DIAGNOSIS — G894 Chronic pain syndrome: Secondary | ICD-10-CM | POA: Diagnosis not present

## 2021-11-22 DIAGNOSIS — N390 Urinary tract infection, site not specified: Secondary | ICD-10-CM | POA: Diagnosis not present

## 2021-11-22 DIAGNOSIS — M810 Age-related osteoporosis without current pathological fracture: Secondary | ICD-10-CM | POA: Diagnosis not present

## 2021-11-22 DIAGNOSIS — Z0189 Encounter for other specified special examinations: Secondary | ICD-10-CM | POA: Diagnosis not present

## 2021-11-22 DIAGNOSIS — I10 Essential (primary) hypertension: Secondary | ICD-10-CM | POA: Diagnosis not present

## 2021-11-22 DIAGNOSIS — I700 Atherosclerosis of aorta: Secondary | ICD-10-CM | POA: Diagnosis not present

## 2021-11-25 DIAGNOSIS — R1012 Left upper quadrant pain: Secondary | ICD-10-CM | POA: Diagnosis not present

## 2021-12-05 DIAGNOSIS — M810 Age-related osteoporosis without current pathological fracture: Secondary | ICD-10-CM | POA: Diagnosis not present

## 2021-12-05 DIAGNOSIS — I10 Essential (primary) hypertension: Secondary | ICD-10-CM | POA: Diagnosis not present

## 2021-12-14 ENCOUNTER — Other Ambulatory Visit (HOSPITAL_COMMUNITY): Payer: Self-pay | Admitting: Family Medicine

## 2021-12-14 DIAGNOSIS — I10 Essential (primary) hypertension: Secondary | ICD-10-CM | POA: Diagnosis not present

## 2021-12-14 DIAGNOSIS — G894 Chronic pain syndrome: Secondary | ICD-10-CM | POA: Diagnosis not present

## 2021-12-14 DIAGNOSIS — I700 Atherosclerosis of aorta: Secondary | ICD-10-CM | POA: Diagnosis not present

## 2021-12-14 DIAGNOSIS — R7301 Impaired fasting glucose: Secondary | ICD-10-CM | POA: Diagnosis not present

## 2021-12-14 DIAGNOSIS — M810 Age-related osteoporosis without current pathological fracture: Secondary | ICD-10-CM

## 2021-12-14 DIAGNOSIS — N390 Urinary tract infection, site not specified: Secondary | ICD-10-CM | POA: Diagnosis not present

## 2021-12-14 DIAGNOSIS — E785 Hyperlipidemia, unspecified: Secondary | ICD-10-CM | POA: Diagnosis not present

## 2022-01-17 ENCOUNTER — Other Ambulatory Visit (HOSPITAL_COMMUNITY): Payer: Self-pay | Admitting: Internal Medicine

## 2022-01-17 DIAGNOSIS — Z1231 Encounter for screening mammogram for malignant neoplasm of breast: Secondary | ICD-10-CM

## 2022-01-20 ENCOUNTER — Encounter: Payer: Self-pay | Admitting: Obstetrics & Gynecology

## 2022-01-20 ENCOUNTER — Ambulatory Visit: Payer: Medicare Other | Admitting: Obstetrics & Gynecology

## 2022-01-20 VITALS — BP 179/98 | HR 73 | Ht 60.0 in | Wt 147.5 lb

## 2022-01-20 DIAGNOSIS — R350 Frequency of micturition: Secondary | ICD-10-CM | POA: Diagnosis not present

## 2022-01-20 DIAGNOSIS — Z4689 Encounter for fitting and adjustment of other specified devices: Secondary | ICD-10-CM

## 2022-01-20 DIAGNOSIS — R8290 Unspecified abnormal findings in urine: Secondary | ICD-10-CM

## 2022-01-20 LAB — POCT URINALYSIS DIPSTICK
Glucose, UA: NEGATIVE
Ketones, UA: NEGATIVE
Nitrite, UA: NEGATIVE
Protein, UA: NEGATIVE

## 2022-01-20 NOTE — Progress Notes (Signed)
Chief Complaint  Patient presents with   Pessary Check    Blood pressure (!) 179/98, pulse 73, height 5' (1.524 m), weight 147 lb 8 oz (66.9 kg).  Shelia Pierce presents today for routine follow up related to her pessary.   She uses a Milex ring with support #5 She reports no vaginal discharge and no vaginal bleeding   Likert scale(1 not bothersome -5 very bothersome)  :  1  Exam reveals no undue vaginal mucosal pressure of breakdown, no discharge and no vaginal bleeding.  Vaginal Epithelial Abnormality Classification System:   0 0    No abnormalities 1    Epithelial erythema 2    Granulation tissue 3    Epithelial break or erosion, 1 cm or less 4    Epithelial break or erosion, 1 cm or greater  The pessary is removed, cleaned and replaced without difficulty.      ICD-10-CM   1. Pessary maintenance Milex ring with support #5  Z46.89     2. Urinary frequency  R35.0 POCT Urinalysis Dipstick    Urine Culture    3. Abnormal urine odor  R82.90 POCT Urinalysis Dipstick    Urine Culture       Shelia Pierce will be sen back in 4 months for continued follow up.  Florian Buff, MD  01/20/2022 11:10 AM

## 2022-01-23 ENCOUNTER — Other Ambulatory Visit: Payer: Self-pay | Admitting: Orthopedic Surgery

## 2022-01-23 LAB — URINE CULTURE

## 2022-01-25 ENCOUNTER — Telehealth: Payer: Self-pay | Admitting: Obstetrics & Gynecology

## 2022-01-25 NOTE — Telephone Encounter (Signed)
Patient called about her abnormal lab results. Please advise.

## 2022-01-27 MED ORDER — AMPICILLIN 500 MG PO CAPS: 500.0000 mg | ORAL_CAPSULE | Freq: Three times a day (TID) | ORAL | 0 refills | Status: DC

## 2022-01-27 NOTE — Addendum Note (Signed)
Addended by: Derrek Monaco A on: 01/27/2022 01:28 PM   Modules accepted: Orders

## 2022-01-27 NOTE — Telephone Encounter (Signed)
Rx ampicillin 500 mg 1 tid for 7 days and push fluids

## 2022-01-27 NOTE — Telephone Encounter (Signed)
Patient called in regards to urine culture.  Dr Elonda Husky is out of town and needs treatment.  Will send to different provider for medication to be sent in.  [facility] in [place].

## 2022-02-15 ENCOUNTER — Other Ambulatory Visit (HOSPITAL_COMMUNITY): Payer: Medicare Other

## 2022-02-20 ENCOUNTER — Ambulatory Visit (HOSPITAL_COMMUNITY)
Admission: RE | Admit: 2022-02-20 | Discharge: 2022-02-20 | Disposition: A | Discharge status: Home / Self Care | Payer: Medicare Other | Source: Ambulatory Visit | Attending: Internal Medicine | Admitting: Internal Medicine

## 2022-02-20 ENCOUNTER — Ambulatory Visit (HOSPITAL_COMMUNITY)
Admission: RE | Admit: 2022-02-20 | Discharge: 2022-02-20 | Disposition: A | Discharge status: Home / Self Care | Payer: Medicare Other | Source: Ambulatory Visit | Attending: Family Medicine | Admitting: Family Medicine

## 2022-02-20 DIAGNOSIS — M810 Age-related osteoporosis without current pathological fracture: Secondary | ICD-10-CM

## 2022-02-20 DIAGNOSIS — Z1231 Encounter for screening mammogram for malignant neoplasm of breast: Secondary | ICD-10-CM | POA: Insufficient documentation

## 2022-03-16 ENCOUNTER — Encounter: Payer: Self-pay | Admitting: Radiology

## 2022-05-01 ENCOUNTER — Other Ambulatory Visit: Payer: Self-pay | Admitting: Orthopedic Surgery

## 2022-05-09 DIAGNOSIS — E119 Type 2 diabetes mellitus without complications: Secondary | ICD-10-CM | POA: Diagnosis not present

## 2022-05-10 DIAGNOSIS — N390 Urinary tract infection, site not specified: Secondary | ICD-10-CM | POA: Diagnosis not present

## 2022-05-22 ENCOUNTER — Ambulatory Visit: Payer: Medicare Other | Admitting: Obstetrics & Gynecology

## 2022-05-22 ENCOUNTER — Encounter: Payer: Self-pay | Admitting: Obstetrics & Gynecology

## 2022-05-22 VITALS — BP 134/85 | HR 84 | Wt 145.0 lb

## 2022-05-22 DIAGNOSIS — Z87440 Personal history of urinary (tract) infections: Secondary | ICD-10-CM | POA: Diagnosis not present

## 2022-05-22 DIAGNOSIS — Z4689 Encounter for fitting and adjustment of other specified devices: Secondary | ICD-10-CM | POA: Diagnosis not present

## 2022-05-22 DIAGNOSIS — N993 Prolapse of vaginal vault after hysterectomy: Secondary | ICD-10-CM | POA: Diagnosis not present

## 2022-05-22 LAB — POCT URINALYSIS DIPSTICK OB
Glucose, UA: NEGATIVE
Ketones, UA: NEGATIVE
Nitrite, UA: NEGATIVE
POC,PROTEIN,UA: NEGATIVE

## 2022-05-22 MED ORDER — NITROFURANTOIN MACROCRYSTAL 50 MG PO CAPS: 50.0000 mg | ORAL_CAPSULE | Freq: Every day | ORAL | 11 refills | Status: DC

## 2022-05-22 NOTE — Progress Notes (Signed)
Chief Complaint  Patient presents with   Pessary Check    Blood pressure 134/85, pulse 84, weight 145 lb (65.8 kg).  Shelia Pierce presents today for routine follow up related to her pessary.   She uses a Milex ring with support #5 She reports no vaginal discharge and no vaginal bleeding   Likert scale(1 not bothersome -5 very bothersome)  :  1  Exam reveals no undue vaginal mucosal pressure of breakdown, no discharge and no vaginal bleeding.  Vaginal Epithelial Abnormality Classification System:   0 0    No abnormalities 1    Epithelial erythema 2    Granulation tissue 3    Epithelial break or erosion, 1 cm or less 4    Epithelial break or erosion, 1 cm or greater  The pessary is removed, cleaned and replaced without difficulty.      ICD-10-CM   1. Pessary maintenance Milex ring with support #5  Z46.89     2. 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 History of UTI: recurrent  Z87.440 POC Urinalysis Dipstick OB   trial nightly macrodantin 50 qhs       [name] will be sen back in 3 months for continued follow up.  Lazaro Arms, MD  05/22/2022 10:52 AM

## 2022-05-24 LAB — URINE CULTURE: Organism ID, Bacteria: NO GROWTH

## 2022-06-09 DIAGNOSIS — I10 Essential (primary) hypertension: Secondary | ICD-10-CM | POA: Diagnosis not present

## 2022-06-09 DIAGNOSIS — M810 Age-related osteoporosis without current pathological fracture: Secondary | ICD-10-CM | POA: Diagnosis not present

## 2022-06-15 DIAGNOSIS — G894 Chronic pain syndrome: Secondary | ICD-10-CM | POA: Diagnosis not present

## 2022-06-15 DIAGNOSIS — E785 Hyperlipidemia, unspecified: Secondary | ICD-10-CM | POA: Diagnosis not present

## 2022-06-15 DIAGNOSIS — R7303 Prediabetes: Secondary | ICD-10-CM | POA: Diagnosis not present

## 2022-06-15 DIAGNOSIS — I700 Atherosclerosis of aorta: Secondary | ICD-10-CM | POA: Diagnosis not present

## 2022-06-15 DIAGNOSIS — Z79899 Other long term (current) drug therapy: Secondary | ICD-10-CM | POA: Diagnosis not present

## 2022-06-15 DIAGNOSIS — I10 Essential (primary) hypertension: Secondary | ICD-10-CM | POA: Diagnosis not present

## 2022-06-15 DIAGNOSIS — K449 Diaphragmatic hernia without obstruction or gangrene: Secondary | ICD-10-CM | POA: Diagnosis not present

## 2022-06-15 DIAGNOSIS — Z713 Dietary counseling and surveillance: Secondary | ICD-10-CM | POA: Diagnosis not present

## 2022-06-15 DIAGNOSIS — M810 Age-related osteoporosis without current pathological fracture: Secondary | ICD-10-CM | POA: Diagnosis not present

## 2022-06-15 DIAGNOSIS — N390 Urinary tract infection, site not specified: Secondary | ICD-10-CM | POA: Diagnosis not present

## 2022-08-02 ENCOUNTER — Other Ambulatory Visit: Payer: Self-pay | Admitting: Orthopedic Surgery

## 2022-09-01 ENCOUNTER — Ambulatory Visit: Payer: Medicare Other | Admitting: Obstetrics & Gynecology

## 2022-09-01 ENCOUNTER — Encounter: Payer: Self-pay | Admitting: Obstetrics & Gynecology

## 2022-09-01 VITALS — BP 176/85 | HR 64 | Ht 60.0 in | Wt 145.0 lb

## 2022-09-01 DIAGNOSIS — R319 Hematuria, unspecified: Secondary | ICD-10-CM | POA: Diagnosis not present

## 2022-09-01 DIAGNOSIS — N993 Prolapse of vaginal vault after hysterectomy: Secondary | ICD-10-CM

## 2022-09-01 DIAGNOSIS — Z4689 Encounter for fitting and adjustment of other specified devices: Secondary | ICD-10-CM

## 2022-09-01 LAB — POCT URINALYSIS DIPSTICK
Glucose, UA: NEGATIVE
Ketones, UA: NEGATIVE
Nitrite, UA: NEGATIVE
Protein, UA: NEGATIVE

## 2022-09-01 NOTE — Progress Notes (Signed)
Chief Complaint  Patient presents with   Pessary Check    Blood pressure (!) 169/85, pulse 67, height 5' (1.524 m), weight 145 lb (65.8 kg).  Shelia Pierce presents today for routine follow up related to her pessary.   She uses a <ilex ring with support #5 She reports no vaginal discharge and no vaginal bleeding   Likert scale(1 not bothersome -5 very bothersome)  :  1  Exam reveals no undue vaginal mucosal pressure of breakdown, no discharge and no vaginal bleeding.  Vaginal Epithelial Abnormality Classification System:   0 0    No abnormalities 1    Epithelial erythema 2    Granulation tissue 3    Epithelial break or erosion, 1 cm or less 4    Epithelial break or erosion, 1 cm or greater  The pessary is removed, cleaned and replaced without difficulty.      ICD-10-CM   1. Pessary maintenance Milex ring with support #5  Z46.89     2. Hematuria, unspecified type  R31.9 POCT Urinalysis Dipstick    Urine Culture    3. 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  Lazaro Arms, MD  09/01/2022 10:38 AM

## 2022-09-05 ENCOUNTER — Other Ambulatory Visit: Payer: Self-pay | Admitting: Obstetrics & Gynecology

## 2022-09-05 LAB — URINE CULTURE

## 2022-09-05 MED ORDER — SULFAMETHOXAZOLE-TRIMETHOPRIM 800-160 MG PO TABS: 1.0000 | ORAL_TABLET | Freq: Two times a day (BID) | ORAL | 0 refills | Status: DC

## 2022-10-04 DIAGNOSIS — Z713 Dietary counseling and surveillance: Secondary | ICD-10-CM | POA: Diagnosis not present

## 2022-10-04 DIAGNOSIS — J302 Other seasonal allergic rhinitis: Secondary | ICD-10-CM | POA: Insufficient documentation

## 2022-10-04 DIAGNOSIS — J069 Acute upper respiratory infection, unspecified: Secondary | ICD-10-CM | POA: Diagnosis not present

## 2022-10-04 DIAGNOSIS — Z6823 Body mass index (BMI) 23.0-23.9, adult: Secondary | ICD-10-CM | POA: Diagnosis not present

## 2022-11-09 ENCOUNTER — Other Ambulatory Visit: Payer: Self-pay | Admitting: Orthopedic Surgery

## 2022-11-29 DIAGNOSIS — Z23 Encounter for immunization: Secondary | ICD-10-CM | POA: Diagnosis not present

## 2022-12-18 DIAGNOSIS — M810 Age-related osteoporosis without current pathological fracture: Secondary | ICD-10-CM | POA: Diagnosis not present

## 2022-12-18 DIAGNOSIS — I10 Essential (primary) hypertension: Secondary | ICD-10-CM | POA: Diagnosis not present

## 2022-12-18 DIAGNOSIS — R7303 Prediabetes: Secondary | ICD-10-CM | POA: Diagnosis not present

## 2022-12-20 DIAGNOSIS — R300 Dysuria: Secondary | ICD-10-CM | POA: Diagnosis not present

## 2022-12-20 DIAGNOSIS — I700 Atherosclerosis of aorta: Secondary | ICD-10-CM | POA: Diagnosis not present

## 2022-12-20 DIAGNOSIS — I10 Essential (primary) hypertension: Secondary | ICD-10-CM | POA: Diagnosis not present

## 2022-12-20 DIAGNOSIS — E1165 Type 2 diabetes mellitus with hyperglycemia: Secondary | ICD-10-CM | POA: Diagnosis not present

## 2022-12-20 DIAGNOSIS — K449 Diaphragmatic hernia without obstruction or gangrene: Secondary | ICD-10-CM | POA: Diagnosis not present

## 2022-12-20 DIAGNOSIS — Z79899 Other long term (current) drug therapy: Secondary | ICD-10-CM | POA: Diagnosis not present

## 2022-12-20 DIAGNOSIS — N390 Urinary tract infection, site not specified: Secondary | ICD-10-CM | POA: Diagnosis not present

## 2022-12-20 DIAGNOSIS — G894 Chronic pain syndrome: Secondary | ICD-10-CM | POA: Diagnosis not present

## 2022-12-20 DIAGNOSIS — E785 Hyperlipidemia, unspecified: Secondary | ICD-10-CM | POA: Diagnosis not present

## 2022-12-20 DIAGNOSIS — M810 Age-related osteoporosis without current pathological fracture: Secondary | ICD-10-CM | POA: Diagnosis not present

## 2022-12-28 ENCOUNTER — Other Ambulatory Visit: Payer: Self-pay

## 2022-12-28 ENCOUNTER — Telehealth: Payer: Self-pay

## 2022-12-28 DIAGNOSIS — E785 Hyperlipidemia, unspecified: Secondary | ICD-10-CM | POA: Insufficient documentation

## 2022-12-28 NOTE — Telephone Encounter (Signed)
Hello,   Auth Submission: DENIED Site of care: Site of care: [facility] Payer: [employer] Medication & CPT/J Code(s) submitted: Leqvio (Inclisiran) [id number] Route of submission (phone, fax, portal): portal Phone #[phone] Fax #[phone] Auth type: Buy/Bill PB Units/visits requested: 284mg , 2 initial doses q3 months + q6 months doses Reference number: [id number]     Authorization has been DENIED because preferred medication is Repatha or Praluent. The denial letter is scanned in the media tab. You have the right to appeal this decision at the number above.

## 2022-12-29 ENCOUNTER — Encounter: Payer: Self-pay | Admitting: Obstetrics & Gynecology

## 2022-12-29 ENCOUNTER — Ambulatory Visit: Payer: Medicare Other | Admitting: Obstetrics & Gynecology

## 2022-12-29 VITALS — BP 139/81 | HR 67 | Ht 60.0 in | Wt 153.5 lb

## 2022-12-29 DIAGNOSIS — Z4689 Encounter for fitting and adjustment of other specified devices: Secondary | ICD-10-CM | POA: Diagnosis not present

## 2022-12-29 DIAGNOSIS — N993 Prolapse of vaginal vault after hysterectomy: Secondary | ICD-10-CM | POA: Diagnosis not present

## 2022-12-29 NOTE — Progress Notes (Signed)
Chief Complaint  Patient presents with   Pessary Check    Blood pressure 139/81, pulse 67, height 5' (1.524 m), weight 153 lb 8 oz (69.6 kg).  Shelia Pierce presents today for routine follow up related to her pessary.   She uses a Milex ring with support #5 She reports no vaginal discharge and no vaginal bleeding   Likert scale(1 not bothersome -5 very bothersome)  :  1  Exam reveals no undue vaginal mucosal pressure of breakdown, no discharge and no vaginal bleeding.  Vaginal Epithelial Abnormality Classification System:   0 0    No abnormalities 1    Epithelial erythema 2    Granulation tissue 3    Epithelial break or erosion, 1 cm or less 4    Epithelial break or erosion, 1 cm or greater  The pessary is removed, cleaned and replaced without difficulty.      ICD-10-CM   1. Pessary maintenance Milex ring with support #5  Z46.89     2. 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  Lazaro Arms, MD  12/29/2022 11:14 AM

## 2023-01-01 ENCOUNTER — Other Ambulatory Visit: Payer: Self-pay | Admitting: Orthopedic Surgery

## 2023-01-24 NOTE — Progress Notes (Signed)
  Intake history:  There were no vitals taken for this visit. There is no height or weight on file to calculate BMI.    WHAT ARE WE SEEING YOU FOR TODAY?   left knee(s)  How long has this bothered you? (DOI?DOS?WS?)  1 month(s) ago  Anticoag.  No  Diabetes No  Heart disease No  Hypertension Yes  SMOKING HX No  Kidney disease No  Any ALLERGIES __none ____________________________________________   Treatment:  Have you taken:  Tylenol  Yes  Advil No  Had PT No  Had injection No  Other  _________________________

## 2023-01-25 ENCOUNTER — Ambulatory Visit: Payer: Medicare Other | Admitting: Orthopedic Surgery

## 2023-01-25 ENCOUNTER — Other Ambulatory Visit (INDEPENDENT_AMBULATORY_CARE_PROVIDER_SITE_OTHER): Payer: Medicare Other

## 2023-01-25 VITALS — BP 174/100 | HR 81

## 2023-01-25 DIAGNOSIS — M25551 Pain in right hip: Secondary | ICD-10-CM

## 2023-01-25 DIAGNOSIS — M4156 Other secondary scoliosis, lumbar region: Secondary | ICD-10-CM

## 2023-01-25 DIAGNOSIS — M545 Low back pain, unspecified: Secondary | ICD-10-CM | POA: Diagnosis not present

## 2023-01-25 NOTE — Patient Instructions (Signed)
 Physical therapy has been ordered for you at [facility]. They should call you to schedule, [phone] is the phone number to call, if you want to call to schedule.

## 2023-01-25 NOTE — Progress Notes (Signed)
 Subjective:     Patient ID: Shelia Pierce, female   DOB: Jun 27, 1940, 83 y.o.   MRN: [id number]  Chief Complaint  Patient presents with   Hip Pain    Right hip pain x 1 month     83 year old female history of hypertension comes in with right hip pain x 1 month.  She indicates she has to take Tylenol  in the morning to get going.  The pain is in the right lower back radiates down the lateral side of the right leg occasionally she may feel some groin pain     Review of Systems  Constitutional:  Negative for fever and unexpected weight change.  Gastrointestinal: Negative.   Genitourinary: Negative.   Neurological:  Negative for numbness.  Musculoskeletal left knee pain from arthritis occasional pain seems to be relieved by meloxicam   Past Medical History:  Diagnosis Date   Colon polyps    Complication of anesthesia    Diabetes mellitus without complication (HCC)    Diet controlled   Hypercholesteremia    Hypertension    Osteoporosis    PONV (postoperative nausea and vomiting)    Sciatic pain, right    Scoliosis    No orders of the defined types were placed in this encounter.       Objective:   Physical Exam Vitals and nursing note reviewed.  Constitutional:      Appearance: Normal appearance.  HENT:     Head: Normocephalic and atraumatic.  Eyes:     General: No scleral icterus.       Right eye: No discharge.        Left eye: No discharge.     Extraocular Movements: Extraocular movements intact.     Conjunctiva/sclera: Conjunctivae normal.     Pupils: Pupils are equal, round, and reactive to light.  Cardiovascular:     Rate and Rhythm: Normal rate.     Pulses: Normal pulses.  Musculoskeletal:     Comments: Reviewed exam  Asymmetry in the spine history of scoliosis no real back pain she has some right buttock tenderness  Hip range of motion normal both lower extremities with no reproduction of pain with range of motion of the hips  Skin:    General: Skin is  warm and dry.     Capillary Refill: Capillary refill takes less than 2 seconds.  Neurological:     General: No focal deficit present.     Mental Status: She is alert and oriented to person, place, and time.  Psychiatric:        Mood and Affect: Mood normal.        Behavior: Behavior normal.        Thought Content: Thought content normal.        Judgment: Judgment normal.        Assessment:     DG Lumbar Spine 2-3 Views Result Date: 01/25/2023 Lumbar 2-3 views Right hip pain X-rays show severe scoliosis of the lumbar spine measuring 50 degrees there is degenerative disc narrowing spondylosis Impression degenerative scoliosis severe 50 degrees   DG Pelvis 1-2 Views Result Date: 01/25/2023 AP pelvis Right hip pain X-rays show minimal narrowing of the hip joints with normal femoral head no osteophytes no shortening Impression mild OA both hips       Plan:      Encounter Diagnoses  Name Primary?   Pain in right hip Yes   Lumbar pain    Other secondary scoliosis, lumbar region  History physical imaging ruled out any hip pathology  The patient's pain is located in the back and related to the degenerative scoliosis and we will recommend physical therapy  Continue meloxicam  for left knee

## 2023-01-26 ENCOUNTER — Other Ambulatory Visit (HOSPITAL_COMMUNITY): Payer: Self-pay | Admitting: Family Medicine

## 2023-01-26 DIAGNOSIS — Z1231 Encounter for screening mammogram for malignant neoplasm of breast: Secondary | ICD-10-CM

## 2023-02-02 ENCOUNTER — Ambulatory Visit (HOSPITAL_COMMUNITY): Payer: Medicare Other | Attending: Orthopedic Surgery

## 2023-02-02 DIAGNOSIS — M25551 Pain in right hip: Secondary | ICD-10-CM | POA: Insufficient documentation

## 2023-02-02 DIAGNOSIS — M5459 Other low back pain: Secondary | ICD-10-CM | POA: Insufficient documentation

## 2023-02-02 DIAGNOSIS — R293 Abnormal posture: Secondary | ICD-10-CM | POA: Insufficient documentation

## 2023-02-02 DIAGNOSIS — M545 Low back pain, unspecified: Secondary | ICD-10-CM | POA: Insufficient documentation

## 2023-02-02 NOTE — Therapy (Signed)
[facility] OUTPATIENT PHYSICAL THERAPY EVALUATION (THORACOLUMBAR)   Patient Name: Shelia Pierce MRN: [id number] DOB:Jan 09, 1941, 83 y.o., female Today's Date: 02/02/2023  END OF SESSION:   PT End of Session - 02/02/23 1047     Visit Number 1    Number of Visits 8    Authorization Type [employer] Medicare    Authorization Time Period Req on 02/02/23    PT Start Time 1045    PT Stop Time 1125    PT Time Calculation (min) 40 min    Activity Tolerance Patient tolerated treatment well    Behavior During Therapy WFL for tasks assessed/performed              Past Medical History:  Diagnosis Date   Colon polyps    Complication of anesthesia    Diabetes mellitus without complication (HCC)    Diet controlled   Hypercholesteremia    Hypertension    Osteoporosis    PONV (postoperative nausea and vomiting)    Sciatic pain, right    Scoliosis    Past Surgical History:  Procedure Laterality Date   ABDOMINAL HYSTERECTOMY     CATARACT EXTRACTION W/PHACO Left 10/18/2018   Procedure: CATARACT EXTRACTION PHACO AND INTRAOCULAR LENS PLACEMENT  LEFT EYE (CDE: 22.04);  Surgeon: Fabio Pierce, MD;  Location: [facility];  Service: Ophthalmology;  Laterality: Left;   CATARACT EXTRACTION W/PHACO Right 11/15/2018   Procedure: CATARACT EXTRACTION PHACO AND INTRAOCULAR LENS PLACEMENT (IOC) (CDE: 23.55);  Surgeon: Fabio Pierce, MD;  Location: [facility];  Service: Ophthalmology;  Laterality: Right;   COLONOSCOPY     COLONOSCOPY N/A 11/21/2012   Procedure: COLONOSCOPY;  Surgeon: Malissa Hippo, MD;  Location: [facility];  Service: Endoscopy;  Laterality: N/A;  1030   COLONOSCOPY N/A 05/25/2017   Procedure: COLONOSCOPY;  Surgeon: Malissa Hippo, MD;  [facility]: [facility];  Service: Endoscopy;  Laterality: N/A;  830   Left breast biopsy     Left foot     pessary     UMBILICAL HERNIA REPAIR N/A 06/25/2020   Procedure: UMBILICAL HERNIA REPAIR WITH MESH;  Surgeon: Lucretia Roers, MD;  Location: [facility];   Service: General;  Laterality: N/A;   Patient Active Problem List   Diagnosis Date Noted   Hyperlipemia 12/28/2022   Hyperglycemia due to type 2 diabetes mellitus (HCC) 12/20/2022   Seasonal allergic rhinitis 10/04/2022   Umbilical hernia without obstruction and without gangrene 06/10/2020   Pelvic relaxation 02/14/2019   Rectal bleeding 04/26/2017    PCP: Benita Stabile, MDRef Provider (PCP)   REFERRING PROVIDER:   Vickki Hearing, MD    REFERRING DIAG:  Diagnosis  M54.50 (ICD-10-CM) - Lumbar pain    Rationale for Evaluation and Treatment: Rehabilitation  THERAPY DIAG:  Abnormal posture - Plan: PT plan of care cert/re-cert  Other low back pain - Plan: PT plan of care cert/re-cert  Pain in right hip - Plan: PT plan of care cert/re-cert  ONSET DATE: 1 month ago    SUBJECTIVE:  SUBJECTIVE STATEMENT: (Goes by [facility] 1 month ago, patient reports that her right hip begin to hurt a lot during walking. Patient began to take Tylenol to help ease pain which has been helping. Patient went to Dr Shelia Pierce 01/25/23. Shelia Pierce performed x-ray to find 50* curve scoliosis. Patient was unaware she always had scoliosis until this year and has managed it her whole life. Patient referred to [facility] for Rehab.    PERTINENT HISTORY:   HTN  Umbilical hernia repair 2022  DM II - diet managed    PAIN:  Are you having pain? Yes: NPRS scale: 5/10 Pain location: right hip/lumbar Pain description: can be sharp upon walking after rest Aggravating factors: walking after being sedentary Relieving factors: n/a  PRECAUTIONS: None  RED FLAGS: None   WEIGHT BEARING RESTRICTIONS: No  FALLS:  Has patient fallen in last 6 months? No  LIVING ENVIRONMENT: Lives with: lives with their spouse Lives in:  House/apartment Stairs: Yes: External: 5 steps; can reach both Has following equipment at home:  elevated toilet How many hours do you sleep every night: ~6 with some repositioning due to pain   OCCUPATION: retired  PLOF: Independent   PATIENT GOALS: To decrease LBP   NEXT MD VISIT: TBD    OBJECTIVE:   DIAGNOSTIC FINDINGS:  Narrative & Impression  Lumbar 2-3 views   Right hip pain   X-rays show severe scoliosis of the lumbar spine measuring 50 degrees there is degenerative disc narrowing spondylosis   Impression degenerative scoliosis severe 50 degrees   Right hip pain   X-rays show minimal narrowing of the hip joints with normal femoral head no osteophytes no shortening   Impression mild OA both hips PATIENT SURVEYS:  FOTO 59.1600  SCREENING FOR RED FLAGS: Bowel or bladder incontinence: No Spinal tumors: No Cauda equina syndrome: No Compression fracture: No Abdominal aneurysm: No  COGNITION: Overall cognitive status: Within functional limits for tasks assessed  POSTURE: decreased lumbar lordosis, increased thoracic kyphosis, and weight shift left      FUNCTIONAL TESTS:  5 times sit to stand: 14.19 with minimal upper extremity push off legs  .20-29 years: 6.0  1.4 seconds 30-39 years: 6.1  1.4 seconds 40-49 years: 7.6  1.8 seconds 50-59 years: 7.7  2.6 seconds 60-69 years: 8.4  0.0 seconds (female), 12.7  1.8 seconds (female) 70-79 years: 11.6  3.4 seconds (female), 13.0  4.8 seconds (female) 80-89 years: 16.7  4.5 seconds (female), 17.2  5.5 seconds (female) 90+ years: 19.5  2.3 seconds (female), 22.9  9.6 seconds (female)  The Minimal Clinically Important Difference (MCID) is 2.3 seconds.  A score of 16 seconds or less indicates that the participant is not likely to fall.  A score of more than 16 seconds indicates a higher risk of falls  As per American Physical Therapy Association (APTA) website   GAIT ANALYSIS: Distance walked:  54ft Assistive device utilized: None Level of assistance: Complete Independence Comments: Patient with moderate left lateral trunk lean  SENSATION: WFL   LUMBAR ROM:   AROM eval  Flexion Mid shin   Extension 10  Right lateral flexion Mid thigh  Left lateral flexion Mid thigh   Right rotation   Left rotation    (Blank rows = not tested; * = limited by pain)  LOWER EXTREMITY MMT:    Left hip/knee grossly 3+ thru 4-/5  Right hip/knee grossly 3+ thru 4-/5   LOWER EXTREMITY ROM:     Left hip/knee AROM grossly WFL except:  Right  hip/knee AROM grossly WFL except: left knee flexion 0-90 pain   LUMBAR SPECIAL TESTS:  True LLD   Right lower extremity: 80cm   Left lower extremity: 78cm  +patient uses heel lift in left shoe   PALPATION: Moderate tenderness to palpation right hip    TODAY'S TREATMENT:                                                                                                                              DATE:   Access Code: [email or web address] URL: [email or web address] Date: 02/02/2023 Prepared by: Seymour Bars  Exercises - Clamshell with Resistance  - 1 x daily - 3 sets - 10 reps - Clamshell with Resistance (Mirrored)  - 1 x daily - 3 sets - 10 reps   PATIENT EDUCATION:  Education details: HEP, sleep positioning  Person educated: Patient Education method: Explanation Education comprehension: verbalized understanding  HOME EXERCISE PROGRAM: Access Code: [email or web address] URL: [email or web address] Date: 02/02/2023 Prepared by: Seymour Bars  Exercises - Clamshell with Resistance  - 1 x daily - 3 sets - 10 reps - Clamshell with Resistance (Mirrored)  - 1 x daily - 3 sets - 10 reps    ASSESSMENT:  CLINICAL IMPRESSION: Patient is a 83 y.o. y.o. female who was seen today for physical therapy evaluation and treatment for abnormal posture, low back pain, right hip pain . Patient presents to PT with the following objective  impairments: Abnormal gait, decreased activity tolerance, decreased mobility, difficulty walking, decreased strength, postural dysfunction, and pain. These impairments limit the patient in activities such as carrying, lifting, bending, squatting, sleeping, stairs, and locomotion level. These impairments also limit the patient in participation such as meal prep, cleaning, interpersonal relationship, driving, shopping, community activity, and yard work. The patient will benefit from PT to address the limitations/impairments listed below to return to their prior level of function in the domains of activity and participation.    PERSONAL FACTORS: Age and PMH scoliosis >/= 50* curve  are also affecting patient's functional outcome.   REHAB POTENTIAL: Good  CLINICAL DECISION MAKING: Stable/uncomplicated  EVALUATION COMPLEXITY: Low    GOALS: Goals reviewed with patient? No  SHORT TERM GOALS: Target date: 02/23/2023    1. Patient will be independent with a basic stretching/strengthening HEP  Baseline:  Goal status: INITIAL   LONG TERM GOALS: Target date: 03/16/2023    Patient will be able to score a >/= 65 on the FOTO    to demonstrate an improvement in overall housework, ADL completion, mobility, and self-care.Baseline:  Goal status: INITIAL  2.   Patient will complete the 5 times sit to stand:    within 13 seconds  to demonstrate an improvement lower extremity strength needed for home and community ambulation  Baseline:  Goal status: INITIAL  3.  Patient will be independent with a comprehensive strengthening HEP  Baseline:  Goal status: INITIAL    PLAN:  PT FREQUENCY: 1-2x/week  PT DURATION: 6 weeks  PLANNED INTERVENTIONS: 97110-Therapeutic exercises, 97530- Therapeutic activity, [id number]- Neuromuscular re-education, [phone]- Self Care, 60454- Manual therapy, [phone]- Gait training, Patient/Family education, Balance training, Stair training, Dry Needling, Joint mobilization, Joint  manipulation, Spinal manipulation, Spinal mobilization, Cryotherapy, and Moist heat.  PLAN FOR NEXT SESSION: Progress hip/lumbar stability for scoliosis posture management; add 1-2x therapeutic exercise to HEP    [facility] Medicare Auth Request Information  Date of referral:   01/25/2023   Referring provider:   Vickki Hearing, MD   Referring diagnosis (ICD 10)?  Diagnosis  M54.50 (ICD-10-CM) - Lumbar pain   Treatment diagnosis (ICD 10)? (if different than referring diagnosis) R29.3, M54.59, M25.551  Functional Tool Score: 5 TSTS: 14.19s  What was this (referring dx) caused by? Ongoing Issue  Ashby Dawes of Condition: Initial Onset (within last 3 months)   Laterality: Both  Current Functional Measure Score: FOTO 59.16  Objective measurements identify impairments when they are compared to normal values, the uninvolved extremity, and prior level of function.  []  Yes  []  No  Objective assessment of functional ability: Minimal functional limitations   Briefly describe symptoms: severe scoliosis leading to right hip/ lumbar pain   How did symptoms start: walking/bending 1 month ago  Average pain intensity:  Last 24 hours: 5/10  Past week: 5/10  How often does the pt experience symptoms? Frequently  How much have the symptoms interfered with usual daily activities? Moderately  How has condition changed since care began at this facility? NA - initial visit  In general, how is the patients overall health? Very Good   BACK PAIN (STarT Back Screening Tool) Has pain spread down the leg(s) at some time in the last 2 weeks? no Has there been pain in the shoulder or neck at some time in the last 2 weeks? yes Has the pt only walked short distances because of back pain? yes Has patient dressed more slowly because of back pain in the past 2 weeks? no Does patient think it's not safe for a person with this condition to be physically active? no Does patient have worrying thoughts a  lot of the time? no Does patient feel back pain is terrible and will never get any better? no Has patient stopped enjoying things they usually enjoy? no    Seymour Bars, PT 02/02/2023, 11:53 AM

## 2023-02-07 ENCOUNTER — Ambulatory Visit (HOSPITAL_COMMUNITY): Payer: Medicare Other

## 2023-02-07 DIAGNOSIS — M25551 Pain in right hip: Secondary | ICD-10-CM | POA: Diagnosis not present

## 2023-02-07 DIAGNOSIS — M5459 Other low back pain: Secondary | ICD-10-CM

## 2023-02-07 DIAGNOSIS — M545 Low back pain, unspecified: Secondary | ICD-10-CM | POA: Diagnosis not present

## 2023-02-07 DIAGNOSIS — R293 Abnormal posture: Secondary | ICD-10-CM | POA: Diagnosis not present

## 2023-02-07 NOTE — Therapy (Signed)
[facility] OUTPATIENT PHYSICAL THERAPY EVALUATION (THORACOLUMBAR)   Patient Name: Shelia Pierce MRN: [id number] DOB:1940-03-24, 83 y.o., female Today's Date: 02/07/2023  END OF SESSION:   PT End of Session - 02/07/23 1113     Visit Number 2    Number of Visits 8    Authorization Type [employer] Medicare    Authorization Time Period Req on 02/02/23    PT Start Time 0930    PT Stop Time 1010    PT Time Calculation (min) 40 min    Activity Tolerance Patient tolerated treatment well    Behavior During Therapy WFL for tasks assessed/performed               Past Medical History:  Diagnosis Date   Colon polyps    Complication of anesthesia    Diabetes mellitus without complication (HCC)    Diet controlled   Hypercholesteremia    Hypertension    Osteoporosis    PONV (postoperative nausea and vomiting)    Sciatic pain, right    Scoliosis    Past Surgical History:  Procedure Laterality Date   ABDOMINAL HYSTERECTOMY     CATARACT EXTRACTION W/PHACO Left 10/18/2018   Procedure: CATARACT EXTRACTION PHACO AND INTRAOCULAR LENS PLACEMENT  LEFT EYE (CDE: 22.04);  Surgeon: Fabio Pierce, MD;  Location: [facility];  Service: Ophthalmology;  Laterality: Left;   CATARACT EXTRACTION W/PHACO Right 11/15/2018   Procedure: CATARACT EXTRACTION PHACO AND INTRAOCULAR LENS PLACEMENT (IOC) (CDE: 23.55);  Surgeon: Fabio Pierce, MD;  Location: [facility];  Service: Ophthalmology;  Laterality: Right;   COLONOSCOPY     COLONOSCOPY N/A 11/21/2012   Procedure: COLONOSCOPY;  Surgeon: Malissa Hippo, MD;  [facility];  Service: Endoscopy;  Laterality: N/A;  1030   COLONOSCOPY N/A 05/25/2017   Procedure: COLONOSCOPY;  Surgeon: Malissa Hippo, MD;  [facility];  Service: Endoscopy;  Laterality: N/A;  830   Left breast biopsy     Left foot     pessary     UMBILICAL HERNIA REPAIR N/A 06/25/2020   Procedure: UMBILICAL HERNIA REPAIR WITH MESH;  Surgeon: Lucretia Roers, MD;  Location: [facility];   Service: General;  Laterality: N/A;   Patient Active Problem List   Diagnosis Date Noted   Hyperlipemia 12/28/2022   Hyperglycemia due to type 2 diabetes mellitus (HCC) 12/20/2022   Seasonal allergic rhinitis 10/04/2022   Umbilical hernia without obstruction and without gangrene 06/10/2020   Pelvic relaxation 02/14/2019   Rectal bleeding 04/26/2017    PCP: Benita Stabile, MDRef Provider (PCP)   REFERRING PROVIDER:   Vickki Hearing, MD    REFERRING DIAG:  Diagnosis  M54.50 (ICD-10-CM) - Lumbar pain    Rationale for Evaluation and Treatment: Rehabilitation  THERAPY DIAG:  Abnormal posture  Other low back pain  Pain in right hip  ONSET DATE: 1 month ago    SUBJECTIVE:  SUBJECTIVE STATEMENT:  Today: Patient reports attempting which helped manage some pain; 3/10 pain today   (Goes by [facility] 1 month ago, patient reports that her right hip begin to hurt a lot during walking. Patient began to take Tylenol to help ease pain which has been helping. Patient went to Dr [name] 01/25/23. [name] performed x-ray to find 50* curve scoliosis. Patient was unaware she always had scoliosis until this year and has managed it her whole life. Patient referred to [facility] for [facility]hab.    PERTINENT HISTORY:   HTN  Umbilical hernia repair 2022  DM II - diet managed    PAIN:  Are you having pain? Yes: NPRS scale: 5/10 Pain location: right hip/lumbar Pain description: can be sharp upon walking after rest Aggravating factors: walking after being sedentary Relieving factors: n/a  PRECAUTIONS: None  RED FLAGS: None   WEIGHT BEARING RESTRICTIONS: No  FALLS:  Has patient fallen in last 6 months? No  LIVING ENVIRONMENT: Lives with: lives with their spouse Lives in: House/apartment Stairs: Yes:  External: 5 steps; can reach both Has following equipment at home:  elevated toilet How many hours do you sleep every night: ~6 with some repositioning due to pain   OCCUPATION: retired  PLOF: Independent   PATIENT GOALS: To decrease LBP   NEXT MD VISIT: TBD    OBJECTIVE:   DIAGNOSTIC FINDINGS:  Narrative & Impression  Lumbar 2-3 views   Right hip pain   X-rays show severe scoliosis of the lumbar spine measuring 50 degrees there is degenerative disc narrowing spondylosis   Impression degenerative scoliosis severe 50 degrees   Right hip pain   X-rays show minimal narrowing of the hip joints with normal femoral head no osteophytes no shortening   Impression mild OA both hips PATIENT SURVEYS:  FOTO 59.1600  SCREENING FOR RED FLAGS: Bowel or bladder incontinence: No Spinal tumors: No Cauda equina syndrome: No Compression fracture: No Abdominal aneurysm: No  COGNITION: Overall cognitive status: Within functional limits for tasks assessed  POSTURE: decreased lumbar lordosis, increased thoracic kyphosis, and weight shift left      FUNCTIONAL TESTS:  5 times sit to stand: 14.19 with minimal upper extremity push off legs  .20-29 years: 6.0  1.4 seconds 30-39 years: 6.1  1.4 seconds 40-49 years: 7.6  1.8 seconds 50-59 years: 7.7  2.6 seconds 60-69 years: 8.4  0.0 seconds (female), 12.7  1.8 seconds (female) 70-79 years: 11.6  3.4 seconds (female), 13.0  4.8 seconds (female) 80-89 years: 16.7  4.5 seconds (female), 17.2  5.5 seconds (female) 90+ years: 19.5  2.3 seconds (female), 22.9  9.6 seconds (female)  The Minimal Clinically Important Difference (MCID) is 2.3 seconds.  A score of 16 seconds or less indicates that the participant is not likely to fall.  A score of more than 16 seconds indicates a higher risk of falls  As per American Physical Therapy Association (APTA) website   GAIT ANALYSIS: Distance walked: 15ft Assistive device utilized:  None Level of assistance: Complete Independence Comments: Patient with moderate left lateral trunk lean  SENSATION: WFL   LUMBAR ROM:   AROM eval  Flexion Mid shin   Extension 10  Right lateral flexion Mid thigh  Left lateral flexion Mid thigh   Right rotation   Left rotation    (Blank rows = not tested; * = limited by pain)  LOWER EXTREMITY MMT:    Left hip/knee grossly 3+ thru 4-/5  Right hip/knee grossly 3+ thru 4-/5  LOWER EXTREMITY ROM:     Left hip/knee AROM grossly WFL except:  Right hip/knee AROM grossly WFL except: left knee flexion 0-90 pain   LUMBAR SPECIAL TESTS:  True LLD   Right lower extremity: 80cm   Left lower extremity: 78cm  +patient uses heel lift in left shoe    PALPATION: Moderate tenderness to palpation right hip    TODAY'S TREATMENT:                                                                                                                              DATE:   12/08/23 Supine  Manual therapy to left lower extremity including: manual stretch to improve knee extension PROM   Left Quad sets 10x3-5"H   Left SAQS with 2#  Hip marches with G theraband 2x10  Hip abduction with G theraband 2x10  Hip bridges + abduction with G theraband 2x10     PATIENT EDUCATION:  Education details: HEP, sleep positioning  Person educated: Patient Education method: Explanation Education comprehension: verbalized understanding  HOME EXERCISE PROGRAM: Access Code: [email or web address] URL: [email or web address] Date: 02/07/2023 Prepared by: Seymour Bars  Exercises - Clamshell with Resistance  - 1 x daily - 3 sets - 10 reps - Clamshell with Resistance (Mirrored)  - 1 x daily - 3 sets - 10 reps - Supine March with Resistance Band  - 1 x daily - 3 sets - 10 reps - Supine Bridge with Resistance Band  - 1 x daily - 3 sets - 10 reps    ASSESSMENT:  CLINICAL IMPRESSION: Today: PT progressed patient through hip/core stability therapeutic  exercise to improve lower extremity strength. PT began session with manual therapy to address lack of knee extension PROM that can be affecting posture. PT then progressed HEP therapeutic exercise. Patient able to tolerate session well with no increase in pain levels.  The patient will benefit from PT to address the limitations/impairments listed below to return to their prior level of function in the domains of activity and participation.  Patient is a 83 y.o. y.o. female who was seen today for physical therapy evaluation and treatment for abnormal posture, low back pain, right hip pain . Patient presents to PT with the following objective impairments: Abnormal gait, decreased activity tolerance, decreased mobility, difficulty walking, decreased strength, postural dysfunction, and pain. These impairments limit the patient in activities such as carrying, lifting, bending, squatting, sleeping, stairs, and locomotion level. These impairments also limit the patient in participation such as meal prep, cleaning, interpersonal relationship, driving, shopping, community activity, and yard work. The patient will benefit from PT to address the limitations/impairments listed below to return to their prior level of function in the domains of activity and participation.    PERSONAL FACTORS: Age and PMH scoliosis >/= 50* curve  are also affecting patient's functional outcome.   REHAB POTENTIAL: Good  CLINICAL DECISION MAKING: Stable/uncomplicated  EVALUATION COMPLEXITY: Low    GOALS: Goals reviewed with patient? No  SHORT TERM GOALS: Target date: 02/23/2023    1. Patient will be independent with a basic stretching/strengthening HEP  Baseline:  Goal status: INITIAL   LONG TERM GOALS: Target date: 03/16/2023    Patient will be able to score a >/= 65 on the FOTO    to demonstrate an improvement in overall housework, ADL completion, mobility, and self-care.Baseline:  Goal status: INITIAL  2.   Patient  will complete the 5 times sit to stand:    within 13 seconds  to demonstrate an improvement lower extremity strength needed for home and community ambulation  Baseline:  Goal status: INITIAL  3.  Patient will be independent with a comprehensive strengthening HEP  Baseline:  Goal status: INITIAL    PLAN:  PT FREQUENCY: 1-2x/week  PT DURATION: 6 weeks  PLANNED INTERVENTIONS: 97110-Therapeutic exercises, 97530- Therapeutic activity, 97112- Neuromuscular re-education, 97535- Self Care, 09811- Manual therapy, [phone]- Gait training, Patient/Family education, Balance training, Stair training, Dry Needling, Joint mobilization, Joint manipulation, Spinal manipulation, Spinal mobilization, Cryotherapy, and Moist heat.  PLAN FOR NEXT SESSION: Progress hip/lumbar stability for scoliosis posture management; add 1-2x therapeutic exercise to HEP    [facility] Medicare Auth Request Information  Date of referral:   01/25/2023   Referring provider:   Vickki Hearing, MD   Referring diagnosis (ICD 10)?  Diagnosis  M54.50 (ICD-10-CM) - Lumbar pain   Treatment diagnosis (ICD 10)? (if different than referring diagnosis) R29.3, M54.59, M25.551  Functional Tool Score: 5 TSTS: 14.19s  What was this (referring dx) caused by? Ongoing Issue  Ashby Dawes of Condition: Initial Onset (within last 3 months)   Laterality: Both  Current Functional Measure Score: FOTO 59.16  Objective measurements identify impairments when they are compared to normal values, the uninvolved extremity, and prior level of function.  []  Yes  []  No  Objective assessment of functional ability: Minimal functional limitations   Briefly describe symptoms: severe scoliosis leading to right hip/ lumbar pain   How did symptoms start: walking/bending 1 month ago  Average pain intensity:  Last 24 hours: 5/10  Past week: 5/10  How often does the pt experience symptoms? Frequently  How much have the symptoms interfered with usual  daily activities? Moderately  How has condition changed since care began at this facility? NA - initial visit  In general, how is the patients overall health? Very Good   BACK PAIN (STarT Back Screening Tool) Has pain spread down the leg(s) at some time in the last 2 weeks? no Has there been pain in the shoulder or neck at some time in the last 2 weeks? yes Has the pt only walked short distances because of back pain? yes Has patient dressed more slowly because of back pain in the past 2 weeks? no Does patient think it's not safe for a person with this condition to be physically active? no Does patient have worrying thoughts a lot of the time? no Does patient feel back pain is terrible and will never get any better? no Has patient stopped enjoying things they usually enjoy? no    Seymour Bars, PT 02/07/2023, 11:14 AM

## 2023-02-09 ENCOUNTER — Ambulatory Visit (HOSPITAL_COMMUNITY): Payer: Medicare Other

## 2023-02-09 ENCOUNTER — Encounter (HOSPITAL_COMMUNITY): Payer: Medicare Other

## 2023-02-09 DIAGNOSIS — M5459 Other low back pain: Secondary | ICD-10-CM

## 2023-02-09 DIAGNOSIS — M25551 Pain in right hip: Secondary | ICD-10-CM | POA: Diagnosis not present

## 2023-02-09 DIAGNOSIS — R293 Abnormal posture: Secondary | ICD-10-CM

## 2023-02-09 DIAGNOSIS — M545 Low back pain, unspecified: Secondary | ICD-10-CM | POA: Diagnosis not present

## 2023-02-09 NOTE — Therapy (Signed)
[facility] OUTPATIENT PHYSICAL THERAPY EVALUATION (THORACOLUMBAR)   Patient Name: Shelia Pierce MRN: [id number] DOB:[birth year], 83 y.o., female Today's Date: 02/09/2023  END OF SESSION:   PT End of Session - 02/09/23 0930     Visit Number 3    Number of Visits 8    Date for PT Re-Evaluation 03/16/23    Authorization Type [employer]    Authorization Time Period 1/17-2/28/25    Authorization - Visit Number 3    Authorization - Number of Visits 8    Progress Note Due on Visit 8    PT Start Time 0930    PT Stop Time 1010    PT Time Calculation (min) 40 min    Activity Tolerance Patient tolerated treatment well    Behavior During Therapy WFL for tasks assessed/performed               Past Medical History:  Diagnosis Date   Colon polyps    Complication of anesthesia    Diabetes mellitus without complication (HCC)    Diet controlled   Hypercholesteremia    Hypertension    Osteoporosis    PONV (postoperative nausea and vomiting)    Sciatic pain, right    Scoliosis    Past Surgical History:  Procedure Laterality Date   ABDOMINAL HYSTERECTOMY     CATARACT EXTRACTION W/PHACO Left 10/18/2018   Procedure: CATARACT EXTRACTION PHACO AND INTRAOCULAR LENS PLACEMENT  LEFT EYE (CDE: 22.04);  Surgeon: Shelia Pierce, MD;  Location: [facility];  Service: Ophthalmology;  Laterality: Left;   CATARACT EXTRACTION W/PHACO Right 11/15/2018   Procedure: CATARACT EXTRACTION PHACO AND INTRAOCULAR LENS PLACEMENT (IOC) (CDE: 23.55);  Surgeon: Shelia Pierce, MD;  Location: [facility];  Service: Ophthalmology;  Laterality: Right;   COLONOSCOPY     COLONOSCOPY N/A 11/21/2012   Procedure: COLONOSCOPY;  Surgeon: Shelia Hippo, MD;  Location: [facility];  Service: Endoscopy;  Laterality: N/A;  1030   COLONOSCOPY N/A 05/25/2017   Procedure: COLONOSCOPY;  Surgeon: Shelia Hippo, MD;  Location: [facility];  Service: Endoscopy;  Laterality: N/A;  830   Left breast biopsy     Left foot     pessary      UMBILICAL HERNIA REPAIR N/A 06/25/2020   Procedure: UMBILICAL HERNIA REPAIR WITH MESH;  Surgeon: Shelia Roers, MD;  Location: [facility];  Service: General;  Laterality: N/A;   Patient Active Problem List   Diagnosis Date Noted   Hyperlipemia 12/28/2022   Hyperglycemia due to type 2 diabetes mellitus (HCC) 12/20/2022   Seasonal allergic rhinitis 10/04/2022   Umbilical hernia without obstruction and without gangrene 06/10/2020   Pelvic relaxation 02/14/2019   Rectal bleeding 04/26/2017    PCP: Shelia Pierce, MDRef Provider (PCP)   REFERRING PROVIDER:   Vickki Hearing, MD    REFERRING DIAG:  Diagnosis  M54.50 (ICD-10-CM) - Lumbar pain    Rationale for Evaluation and Treatment: Rehabilitation  THERAPY DIAG:  Abnormal posture  Other low back pain  Pain in right hip  ONSET DATE: 1 month ago    SUBJECTIVE:  SUBJECTIVE STATEMENT:  Today(Goes by Shelia Pierce): Patient reports MILD pain today    About 1 month ago, patient reports that her right hip begin to hurt a lot during walking. Patient began to take Tylenol to help ease pain which has been helping. Patient went to Dr [name] 01/25/23. [name] performed x-ray to find 50* curve scoliosis. Patient was unaware she always had scoliosis until this year and has managed it her whole life. Patient referred to [facility] for Rehab.    PERTINENT HISTORY:   HTN  Umbilical hernia repair 2022  DM II - diet managed    PAIN:  Are you having pain? Yes: NPRS scale: 5/10 Pain location: right hip/lumbar Pain description: can be sharp upon walking after rest Aggravating factors: walking after being sedentary Relieving factors: n/a  PRECAUTIONS: None  RED FLAGS: None   WEIGHT BEARING RESTRICTIONS: No  FALLS:  Has patient fallen in last 6 months?  No  LIVING ENVIRONMENT: Lives with: lives with their spouse Lives in: House/apartment Stairs: Yes: External: 5 steps; can reach both Has following equipment at home:  elevated toilet How many hours do you sleep every night: ~6 with some repositioning due to pain   OCCUPATION: retired  PLOF: Independent   PATIENT GOALS: To decrease LBP   NEXT MD VISIT: TBD    OBJECTIVE:   DIAGNOSTIC FINDINGS:  Narrative & Impression  Lumbar 2-3 views   Right hip pain   X-rays show severe scoliosis of the lumbar spine measuring 50 degrees there is degenerative disc narrowing spondylosis   Impression degenerative scoliosis severe 50 degrees   Right hip pain   X-rays show minimal narrowing of the hip joints with normal femoral head no osteophytes no shortening   Impression mild OA both hips PATIENT SURVEYS:  FOTO 59.1600  SCREENING FOR RED FLAGS: Bowel or bladder incontinence: No Spinal tumors: No Cauda equina syndrome: No Compression fracture: No Abdominal aneurysm: No  COGNITION: Overall cognitive status: Within functional limits for tasks assessed  POSTURE: decreased lumbar lordosis, increased thoracic kyphosis, and weight shift left      FUNCTIONAL TESTS:  5 times sit to stand: 14.19 with minimal upper extremity push off legs  .20-29 years: 6.0  1.4 seconds 30-39 years: 6.1  1.4 seconds 40-49 years: 7.6  1.8 seconds 50-59 years: 7.7  2.6 seconds 60-69 years: 8.4  0.0 seconds (female), 12.7  1.8 seconds (female) 70-79 years: 11.6  3.4 seconds (female), 13.0  4.8 seconds (female) 80-89 years: 16.7  4.5 seconds (female), 17.2  5.5 seconds (female) 90+ years: 19.5  2.3 seconds (female), 22.9  9.6 seconds (female)  The Minimal Clinically Important Difference (MCID) is 2.3 seconds.  A score of 16 seconds or less indicates that the participant is not likely to fall.  A score of more than 16 seconds indicates a higher risk of falls  As per American Physical Therapy  Association (APTA) website   GAIT ANALYSIS: Distance walked: 59ft Assistive device utilized: None Level of assistance: Complete Independence Comments: Patient with moderate left lateral trunk lean  SENSATION: WFL   LUMBAR ROM:   AROM eval  Flexion Mid shin   Extension 10  Right lateral flexion Mid thigh  Left lateral flexion Mid thigh   Right rotation   Left rotation    (Blank rows = not tested; * = limited by pain)  LOWER EXTREMITY MMT:    Left hip/knee grossly 3+ thru 4-/5  Right hip/knee grossly 3+ thru 4-/5   LOWER EXTREMITY ROM:  Left hip/knee AROM grossly WFL except:  Right hip/knee AROM grossly WFL except: left knee flexion 0-90 pain   LUMBAR SPECIAL TESTS:  True LLD   Right lower extremity: 80cm   Left lower extremity: 78cm  +patient uses heel lift in left shoe    PALPATION: Moderate tenderness to palpation right hip    TODAY'S TREATMENT:                                                                                                                              DATE:   02/09/23 Standing  Shoulder Ext with theraband + Marches, PT contact guard assist 2x10  Hip abduction in // bars with 3# 2x10   Hip flexion in // bars with 3# 2x10  Weighted sled pushes with 40# x100 ft 2 laps  NuStep L3 cooldown   02/07/23 Supine  Manual therapy to left lower extremity including: manual stretch to improve knee extension PROM   Left Quad sets 10x3-5"H   Left SAQS with 2#  Hip marches with G theraband 2x10  Hip abduction with G theraband 2x10  Hip bridges + abduction with G theraband 2x10     PATIENT EDUCATION:  Education details: HEP, sleep positioning  Person educated: Patient Education method: Explanation Education comprehension: verbalized understanding  HOME EXERCISE PROGRAM: Access Code: [email or web address] URL: [email or web address] Date: 02/07/2023 Prepared by: Seymour Bars  Exercises - Clamshell with Resistance  - 1 x daily - 3 sets -  10 reps - Clamshell with Resistance (Mirrored)  - 1 x daily - 3 sets - 10 reps - Supine March with Resistance Band  - 1 x daily - 3 sets - 10 reps - Supine Bridge with Resistance Band  - 1 x daily - 3 sets - 10 reps   ASSESSMENT:  CLINICAL IMPRESSION: Today: PT progressed patient through  therapeutic exercise to improve lower extremity strength/ balance. PT added lower extremity resistance and Therapeutic Activity such as weighted sled pushes to facilitate pushing/squatting.  Patient able to tolerate session well with no increase in pain levels.  The patient will benefit from PT to address the limitations/impairments listed below to return to their prior level of function in the domains of activity and participation.  Patient is a 83 y.o. y.o. female who was seen today for physical therapy evaluation and treatment for abnormal posture, low back pain, right hip pain . Patient presents to PT with the following objective impairments: Abnormal gait, decreased activity tolerance, decreased mobility, difficulty walking, decreased strength, postural dysfunction, and pain. These impairments limit the patient in activities such as carrying, lifting, bending, squatting, sleeping, stairs, and locomotion level. These impairments also limit the patient in participation such as meal prep, cleaning, interpersonal relationship, driving, shopping, community activity, and yard work. The patient will benefit from PT to address the limitations/impairments listed below to return to their prior level of function in the domains of activity and participation.    PERSONAL FACTORS: Age and PMH  scoliosis >/= 50* curve  are also affecting patient's functional outcome.   REHAB POTENTIAL: Good  CLINICAL DECISION MAKING: Stable/uncomplicated  EVALUATION COMPLEXITY: Low    GOALS: Goals reviewed with patient? No  SHORT TERM GOALS: Target date: 02/23/2023    1. Patient will be independent with a basic  stretching/strengthening HEP  Baseline:  Goal status: INITIAL   LONG TERM GOALS: Target date: 03/16/2023    Patient will be able to score a >/= 65 on the FOTO    to demonstrate an improvement in overall housework, ADL completion, mobility, and self-care.Baseline:  Goal status: INITIAL  2.   Patient will complete the 5 times sit to stand:    within 13 seconds  to demonstrate an improvement lower extremity strength needed for home and community ambulation  Baseline:  Goal status: INITIAL  3.  Patient will be independent with a comprehensive strengthening HEP  Baseline:  Goal status: INITIAL    PLAN:  PT FREQUENCY: 1-2x/week  PT DURATION: 6 weeks  PLANNED INTERVENTIONS: 97110-Therapeutic exercises, 97530- Therapeutic activity, 97112- Neuromuscular re-education, 97535- Self Care, 21308- Manual therapy, [phone]- Gait training, Patient/Family education, Balance training, Stair training, Dry Needling, Joint mobilization, Joint manipulation, Spinal manipulation, Spinal mobilization, Cryotherapy, and Moist heat.  PLAN FOR NEXT SESSION: Progress hip/lumbar strength for scoliosis posture management; add 1-2x therapeutic exercise to [employer], PT 02/09/2023, 9:30 AM

## 2023-02-13 ENCOUNTER — Ambulatory Visit (HOSPITAL_COMMUNITY): Payer: Medicare Other

## 2023-02-13 DIAGNOSIS — M5459 Other low back pain: Secondary | ICD-10-CM

## 2023-02-13 DIAGNOSIS — M545 Low back pain, unspecified: Secondary | ICD-10-CM | POA: Diagnosis not present

## 2023-02-13 DIAGNOSIS — R293 Abnormal posture: Secondary | ICD-10-CM

## 2023-02-13 DIAGNOSIS — M25551 Pain in right hip: Secondary | ICD-10-CM | POA: Diagnosis not present

## 2023-02-13 NOTE — Therapy (Unsigned)
[facility] OUTPATIENT PHYSICAL THERAPY    Patient Name: Shelia Pierce MRN: [id number] DOB:19-Mar-1940, 83 y.o., female Today's Date: 02/13/2023  END OF SESSION:   PT End of Session - 02/13/23 1107     Visit Number 4    Number of Visits 8    Date for PT Re-Evaluation 03/16/23    Authorization Type [employer] Medicare    Authorization Time Period 1/17-2/28/25    Authorization - Number of Visits 8    Progress Note Due on Visit 8    PT Start Time 1105    PT Stop Time 1145    PT Time Calculation (min) 40 min    Activity Tolerance Patient tolerated treatment well    Behavior During Therapy WFL for tasks assessed/performed               Past Medical History:  Diagnosis Date   Colon polyps    Complication of anesthesia    Diabetes mellitus without complication (HCC)    Diet controlled   Hypercholesteremia    Hypertension    Osteoporosis    PONV (postoperative nausea and vomiting)    Sciatic pain, right    Scoliosis    Past Surgical History:  Procedure Laterality Date   ABDOMINAL HYSTERECTOMY     CATARACT EXTRACTION W/PHACO Left 10/18/2018   Procedure: CATARACT EXTRACTION PHACO AND INTRAOCULAR LENS PLACEMENT  LEFT EYE (CDE: 22.04);  Surgeon: Fabio Pierce, MD;  Location: [facility];  Service: Ophthalmology;  Laterality: Left;   CATARACT EXTRACTION W/PHACO Right 11/15/2018   Procedure: CATARACT EXTRACTION PHACO AND INTRAOCULAR LENS PLACEMENT (IOC) (CDE: 23.55);  Surgeon: Fabio Pierce, MD;  Location: [facility];  Service: Ophthalmology;  Laterality: Right;   COLONOSCOPY     COLONOSCOPY N/A 11/21/2012   Procedure: COLONOSCOPY;  Surgeon: Malissa Hippo, MD;  [facility]: [facility];  Service: Endoscopy;  Laterality: N/A;  1030   COLONOSCOPY N/A 05/25/2017   Procedure: COLONOSCOPY;  Surgeon: Malissa Hippo, MD;  Location: [facility];  Service: Endoscopy;  Laterality: N/A;  830   Left breast biopsy     Left foot     pessary     UMBILICAL HERNIA REPAIR N/A 06/25/2020   Procedure: UMBILICAL  HERNIA REPAIR WITH MESH;  Surgeon: Lucretia Roers, MD;  Location: [facility];  Service: General;  Laterality: N/A;   Patient Active Problem List   Diagnosis Date Noted   Hyperlipemia 12/28/2022   Hyperglycemia due to type 2 diabetes mellitus (HCC) 12/20/2022   Seasonal allergic rhinitis 10/04/2022   Umbilical hernia without obstruction and without gangrene 06/10/2020   Pelvic relaxation 02/14/2019   Rectal bleeding 04/26/2017    PCP: Benita Stabile, MDRef Provider (PCP)   REFERRING PROVIDER:   Vickki Hearing, MD    REFERRING DIAG:  Diagnosis  M54.50 (ICD-10-CM) - Lumbar pain    Rationale for Evaluation and Treatment: Rehabilitation  THERAPY DIAG:  No diagnosis found.  ONSET DATE: 1 month ago    SUBJECTIVE:  SUBJECTIVE STATEMENT:  Today(Goes by Shelia Pierce): Patient reports MILD soreness in her left side.    About 1 month ago, patient reports that her right hip begin to hurt a lot during walking. Patient began to take Tylenol to help ease pain which has been helping. Patient went to Dr [name] 01/25/23. [name] performed x-ray to find 50* curve scoliosis. Patient was unaware she always had scoliosis until this year and has managed it her whole life. Patient referred to [facility] for Rehab.    PERTINENT HISTORY:   HTN  Umbilical hernia repair 2022  DM II - diet managed    PAIN:  Are you having pain? Yes: NPRS scale: 5/10 Pain location: right hip/lumbar Pain description: can be sharp upon walking after rest Aggravating factors: walking after being sedentary Relieving factors: n/a  PRECAUTIONS: None  RED FLAGS: None   WEIGHT BEARING RESTRICTIONS: No  FALLS:  Has patient fallen in last 6 months? No  LIVING ENVIRONMENT: Lives with: lives with their spouse Lives in:  House/apartment Stairs: Yes: External: 5 steps; can reach both Has following equipment at home:  elevated toilet How many hours do you sleep every night: ~6 with some repositioning due to pain   OCCUPATION: retired  PLOF: Independent   PATIENT GOALS: To decrease LBP   NEXT MD VISIT: TBD    OBJECTIVE:   DIAGNOSTIC FINDINGS:  Narrative & Impression  Lumbar 2-3 views   Right hip pain   X-rays show severe scoliosis of the lumbar spine measuring 50 degrees there is degenerative disc narrowing spondylosis   Impression degenerative scoliosis severe 50 degrees   Right hip pain   X-rays show minimal narrowing of the hip joints with normal femoral head no osteophytes no shortening   Impression mild OA both hips PATIENT SURVEYS:  FOTO 59.1600  SCREENING FOR RED FLAGS: Bowel or bladder incontinence: No Spinal tumors: No Cauda equina syndrome: No Compression fracture: No Abdominal aneurysm: No  COGNITION: Overall cognitive status: Within functional limits for tasks assessed  POSTURE: decreased lumbar lordosis, increased thoracic kyphosis, and weight shift left      FUNCTIONAL TESTS:  5 times sit to stand: 14.19 with minimal upper extremity push off legs  .20-29 years: 6.0  1.4 seconds 30-39 years: 6.1  1.4 seconds 40-49 years: 7.6  1.8 seconds 50-59 years: 7.7  2.6 seconds 60-69 years: 8.4  0.0 seconds (female), 12.7  1.8 seconds (female) 70-79 years: 11.6  3.4 seconds (female), 13.0  4.8 seconds (female) 80-89 years: 16.7  4.5 seconds (female), 17.2  5.5 seconds (female) 90+ years: 19.5  2.3 seconds (female), 22.9  9.6 seconds (female)  The Minimal Clinically Important Difference (MCID) is 2.3 seconds.  A score of 16 seconds or less indicates that the participant is not likely to fall.  A score of more than 16 seconds indicates a higher risk of falls  As per American Physical Therapy Association (APTA) website   GAIT ANALYSIS: Distance walked:  87ft Assistive device utilized: None Level of assistance: Complete Independence Comments: Patient with moderate left lateral trunk lean  SENSATION: WFL   LUMBAR ROM:   AROM eval  Flexion Mid shin   Extension 10  Right lateral flexion Mid thigh  Left lateral flexion Mid thigh   Right rotation   Left rotation    (Blank rows = not tested; * = limited by pain)  LOWER EXTREMITY MMT:    Left hip/knee grossly 3+ thru 4-/5  Right hip/knee grossly 3+ thru 4-/5   LOWER EXTREMITY  ROM:     Left hip/knee AROM grossly WFL except:  Right hip/knee AROM grossly WFL except: left knee flexion 0-90 pain   LUMBAR SPECIAL TESTS:  True LLD   Right lower extremity: 80cm   Left lower extremity: 78cm  +patient uses heel lift in left shoe    PALPATION: Moderate tenderness to palpation right hip    TODAY'S TREATMENT:                                                                                                                              DATE:   02/12/22 HEP Review/demo- see below  Standing Weighted sled pushes with 60# 76ft x 4 laps, PT contact guard assist [place] carries 20 ft x 4, PT stand-by assist     02/09/23 Standing  Shoulder Ext with theraband + Marches, PT contact guard assist 2x10  Hip abduction in // bars with 3# 2x10   Hip flexion in // bars with 3# 2x10  Weighted sled pushes with 40# x100 ft 2 laps  NuStep L3 cooldown   02/07/23 Supine  Manual therapy to left lower extremity including: manual stretch to improve knee extension PROM   Left Quad sets 10x3-5"H   Left SAQS with 2#  Hip marches with G theraband 2x10  Hip abduction with G theraband 2x10  Hip bridges + abduction with G theraband 2x10     PATIENT EDUCATION:  Education details: HEP, sleep positioning  Person educated: Patient Education method: Explanation Education comprehension: verbalized understanding  HOME EXERCISE PROGRAM: Access Code: [email or web address] URL:  [email or web address] Date: 02/07/2023 Prepared by: Seymour Bars  Exercises - Clamshell with Resistance  - 1 x daily - 3 sets - 10 reps - Clamshell with Resistance (Mirrored)  - 1 x daily - 3 sets - 10 reps - Supine March with Resistance Band  - 1 x daily - 3 sets - 10 reps - Supine Bridge with Resistance Band  - 1 x daily - 3 sets - 10 reps   ASSESSMENT:  CLINICAL IMPRESSION: Today: PT progressed patient through  therapeutic exercise to improve lower extremity strength/ balance. PT added lower extremity resistance and Therapeutic Activity such as weighted sled pushes to facilitate pushing/squatting.  Patient able to tolerate session well with no increase in pain levels.  The patient will benefit from PT to address the limitations/impairments listed below to return to their prior level of function in the domains of activity and participation.  Patient is a 83 y.o. y.o. female who was seen today for physical therapy evaluation and treatment for abnormal posture, low back pain, right hip pain . Patient presents to PT with the following objective impairments: Abnormal gait, decreased activity tolerance, decreased mobility, difficulty walking, decreased strength, postural dysfunction, and pain. These impairments limit the patient in activities such as carrying, lifting, bending, squatting, sleeping, stairs, and locomotion level. These impairments also limit the patient in participation such as meal prep, cleaning, interpersonal relationship, driving, shopping, community  activity, and yard work. The patient will benefit from PT to address the limitations/impairments listed below to return to their prior level of function in the domains of activity and participation.    PERSONAL FACTORS: Age and PMH scoliosis >/= 50* curve  are also affecting patient's functional outcome.   REHAB POTENTIAL: Good  CLINICAL DECISION MAKING: Stable/uncomplicated  EVALUATION COMPLEXITY:  Low    GOALS: Goals reviewed with patient? No  SHORT TERM GOALS: Target date: 02/23/2023    1. Patient will be independent with a basic stretching/strengthening HEP  Baseline:  Goal status: INITIAL   LONG TERM GOALS: Target date: 03/16/2023    Patient will be able to score a >/= 65 on the FOTO    to demonstrate an improvement in overall housework, ADL completion, mobility, and self-care.Baseline:  Goal status: INITIAL  2.   Patient will complete the 5 times sit to stand:    within 13 seconds  to demonstrate an improvement lower extremity strength needed for home and community ambulation  Baseline:  Goal status: INITIAL  3.  Patient will be independent with a comprehensive strengthening HEP  Baseline:  Goal status: INITIAL    PLAN:  PT FREQUENCY: 1-2x/week  PT DURATION: 6 weeks  PLANNED INTERVENTIONS: 97110-Therapeutic exercises, 97530- Therapeutic activity, 97112- Neuromuscular re-education, 97535- Self Care, 16109- Manual therapy, [phone]- Gait training, Patient/Family education, Balance training, Stair training, Dry Needling, Joint mobilization, Joint manipulation, Spinal manipulation, Spinal mobilization, Cryotherapy, and Moist heat.  PLAN FOR NEXT SESSION: Progress hip/lumbar strength for scoliosis posture management; add 1-2x therapeutic exercise to [employer], PT 02/13/2023, 11:08 AM

## 2023-02-22 ENCOUNTER — Ambulatory Visit (HOSPITAL_COMMUNITY): Payer: Medicare Other | Attending: Orthopedic Surgery | Admitting: Physical Therapy

## 2023-02-22 DIAGNOSIS — M5459 Other low back pain: Secondary | ICD-10-CM | POA: Insufficient documentation

## 2023-02-22 DIAGNOSIS — M25551 Pain in right hip: Secondary | ICD-10-CM | POA: Insufficient documentation

## 2023-02-22 DIAGNOSIS — R293 Abnormal posture: Secondary | ICD-10-CM | POA: Diagnosis not present

## 2023-02-22 NOTE — Therapy (Signed)
 [facility]    Patient Name: Shelia Pierce MRN: [id number] DOB:Nov 01, 1940, 83 y.o., female Today's Date: 02/22/2023  END OF SESSION:   PT End of Session - 02/22/23 0851     Visit Number 5    Number of Visits 8    Date for PT Re-Evaluation 03/16/23    Authorization Type [employer] Medicare    Authorization Time Period 1/17-2/28/25    Authorization - Visit Number 5    Authorization - Number of Visits 8    Progress Note Due on Visit 8    PT Start Time 0848    PT Stop Time 0926    PT Time Calculation (min) 38 min    Activity Tolerance Patient tolerated treatment well    Behavior During Therapy WFL for tasks assessed/performed                Past Medical History:  Diagnosis Date   Colon polyps    Complication of anesthesia    Diabetes mellitus without complication (HCC)    Diet controlled   Hypercholesteremia    Hypertension    Osteoporosis    PONV (postoperative nausea and vomiting)    Sciatic pain, right    Scoliosis    Past Surgical History:  Procedure Laterality Date   ABDOMINAL HYSTERECTOMY     CATARACT EXTRACTION W/PHACO Left 10/18/2018   Procedure: CATARACT EXTRACTION PHACO AND INTRAOCULAR LENS PLACEMENT  LEFT EYE (CDE: 22.04);  Surgeon: Harrie Agent, MD;  Location: [facility];  Service: Ophthalmology;  Laterality: Left;   CATARACT EXTRACTION W/PHACO Right 11/15/2018   Procedure: CATARACT EXTRACTION PHACO AND INTRAOCULAR LENS PLACEMENT (IOC) (CDE: 23.55);  Surgeon: Harrie Agent, MD;  Location: [facility];  Service: Ophthalmology;  Laterality: Right;   COLONOSCOPY     COLONOSCOPY N/A 11/21/2012   Procedure: COLONOSCOPY;  Surgeon: Claudis RAYMOND Rivet, MD;  Location: [facility];  Service: Endoscopy;  Laterality: N/A;  1030   COLONOSCOPY N/A 05/25/2017   Procedure: COLONOSCOPY;  Surgeon: Rivet Claudis RAYMOND, MD;  Location: [facility];  Service: Endoscopy;  Laterality: N/A;  830   Left breast biopsy     Left foot     pessary     UMBILICAL HERNIA REPAIR N/A  06/25/2020   Procedure: UMBILICAL HERNIA REPAIR WITH MESH;  Surgeon: Kallie Manuelita BROCKS, MD;  Location: [facility];  Service: General;  Laterality: N/A;   Patient Active Problem List   Diagnosis Date Noted   Hyperlipemia 12/28/2022   Hyperglycemia due to type 2 diabetes mellitus (HCC) 12/20/2022   Seasonal allergic rhinitis 10/04/2022   Umbilical hernia without obstruction and without gangrene 06/10/2020   Pelvic relaxation 02/14/2019   Rectal bleeding 04/26/2017    PCP: Shona Norleen PEDLAR, MDRef Provider (PCP)   REFERRING PROVIDER:   Margrette Taft BRAVO, MD    REFERRING DIAG:  Diagnosis  M54.50 (ICD-10-CM) - Lumbar pain    Rationale for Evaluation and Treatment: Rehabilitation  THERAPY DIAG:  Abnormal posture  Other low back pain  Pain in right hip  ONSET DATE: 1 month ago    SUBJECTIVE:  SUBJECTIVE STATEMENT:  Pt reports she is doing well today, no issues.   About 1 month ago, patient reports that her right hip begin to hurt a lot during walking. Patient began to take Tylenol  to help ease pain which has been helping. Patient went to Dr Margrette 01/25/23. Margrette performed x-ray to find 50* curve scoliosis. Patient was unaware she always had scoliosis until this year and has managed it her whole life. Patient referred to [facility] for Rehab.    PERTINENT HISTORY:   HTN  Umbilical hernia repair 2022  DM II - diet managed    PAIN:  Are you having pain? Yes: NPRS scale: 5/10 Pain location: right hip/lumbar Pain description: can be sharp upon walking after rest Aggravating factors: walking after being sedentary Relieving factors: n/a  PRECAUTIONS: None  RED FLAGS: None   WEIGHT BEARING RESTRICTIONS: No  FALLS:  Has patient fallen in last 6 months? No  LIVING ENVIRONMENT: Lives with:  lives with their spouse Lives in: House/apartment Stairs: Yes: External: 5 steps; can reach both Has following equipment at home:  elevated toilet How many hours do you sleep every night: ~6 with some repositioning due to pain   OCCUPATION: retired  PLOF: Independent   PATIENT GOALS: To decrease LBP   NEXT MD VISIT: TBD    OBJECTIVE:   DIAGNOSTIC FINDINGS:  Narrative & Impression  Lumbar 2-3 views   Right hip pain   X-rays show severe scoliosis of the lumbar spine measuring 50 degrees there is degenerative disc narrowing spondylosis   Impression degenerative scoliosis severe 50 degrees   Right hip pain   X-rays show minimal narrowing of the hip joints with normal femoral head no osteophytes no shortening   Impression mild OA both hips PATIENT SURVEYS:  FOTO 59.1600  SCREENING FOR RED FLAGS: Bowel or bladder incontinence: No Spinal tumors: No Cauda equina syndrome: No Compression fracture: No Abdominal aneurysm: No  COGNITION: Overall cognitive status: Within functional limits for tasks assessed  POSTURE: decreased lumbar lordosis, increased thoracic kyphosis, and weight shift left      FUNCTIONAL TESTS:  5 times sit to stand: 14.19 with minimal upper extremity push off legs  .20-29 years: 6.0  1.4 seconds 30-39 years: 6.1  1.4 seconds 40-49 years: 7.6  1.8 seconds 50-59 years: 7.7  2.6 seconds 60-69 years: 8.4  0.0 seconds (female), 12.7  1.8 seconds (female) 70-79 years: 11.6  3.4 seconds (female), 13.0  4.8 seconds (female) 80-89 years: 16.7  4.5 seconds (female), 17.2  5.5 seconds (female) 90+ years: 19.5  2.3 seconds (female), 22.9  9.6 seconds (female)  The Minimal Clinically Important Difference (MCID) is 2.3 seconds.  A score of 16 seconds or less indicates that the participant is not likely to fall.  A score of more than 16 seconds indicates a higher risk of falls  As per American Physical Therapy Association (APTA) website   GAIT  ANALYSIS: Distance walked: 17ft Assistive device utilized: None Level of assistance: Complete Independence Comments: Patient with moderate left lateral trunk lean  SENSATION: WFL   LUMBAR ROM:   AROM eval  Flexion Mid shin   Extension 10  Right lateral flexion Mid thigh  Left lateral flexion Mid thigh   Right rotation   Left rotation    (Blank rows = not tested; * = limited by pain)  LOWER EXTREMITY MMT:    Left hip/knee grossly 3+ thru 4-/5  Right hip/knee grossly 3+ thru 4-/5   LOWER EXTREMITY ROM:  Left hip/knee AROM grossly WFL except:  Right hip/knee AROM grossly WFL except: left knee flexion 0-90 pain   LUMBAR SPECIAL TESTS:  True LLD   Right lower extremity: 80cm   Left lower extremity: 78cm  +patient uses heel lift in left shoe    PALPATION: Moderate tenderness to palpation right hip    TODAY'S TREATMENT:                                                                                                                              DATE:  02/22/23 Heelraises 20X Hip abduction 3# 2X10 Hip extension 3# 2X10 Forward lunge onto 4 box 2X10 each no UE Vectors 2X5 each LE with 1 HHA 5 holds Tidal tank carries 20 ftX 4 RT, SBA  02/13/23 HEP Review/demo- see below  Standing Weighted sled pushes with 60# 59ft x 4 laps, PT contact guard assist Tidal Tank carries 20 ft x 4, PT stand-by assist     02/09/23 Standing  Shoulder Ext with theraband + Marches, PT contact guard assist 2x10  Hip abduction in // bars with 3# 2x10   Hip flexion in // bars with 3# 2x10  Weighted sled pushes with 40# x100 ft 2 laps  NuStep L3 cooldown   02/07/23 Supine  Manual therapy to left lower extremity including: manual stretch to improve knee extension PROM   Left Quad sets 10x3-5H   Left SAQS with 2#  Hip marches with G theraband 2x10  Hip abduction with G theraband 2x10  Hip bridges + abduction with G theraband 2x10     PATIENT EDUCATION:  Education details: HEP,  sleep positioning  Person educated: Patient Education method: Explanation Education comprehension: verbalized understanding  HOME EXERCISE PROGRAM: Access Code: [email or web address] URL: [email or web address] Date: 02/07/2023 Prepared by: Deward Ming  Exercises - Clamshell with Resistance  - 1 x daily - 3 sets - 10 reps - Clamshell with Resistance (Mirrored)  - 1 x daily - 3 sets - 10 reps - Supine March with Resistance Band  - 1 x daily - 3 sets - 10 reps - Supine Bridge with Resistance Band  - 1 x daily - 3 sets - 10 reps   ASSESSMENT:  CLINICAL IMPRESSION: Today: PT progressed patient through  therapeutic exercise to improve lower extremity strength/ balance. PT added vector stance to help improve hip and LE stability to progress balance and standing activities. No rest breaks needed during session today with good activity tolerance noted.  Patient able to tolerate session well with no increase in pain levels.   Patient is a 82 y.o. y.o. female who was seen today for physical therapy evaluation and treatment for abnormal posture, low back pain, right hip pain . Patient presents to PT with the following objective impairments: Abnormal gait, decreased activity tolerance, decreased mobility, difficulty walking, decreased strength, postural dysfunction, and pain. These impairments limit the patient in activities such as carrying, lifting, bending, squatting, sleeping, stairs, and locomotion level.  These impairments also limit the patient in participation such as meal prep, cleaning, interpersonal relationship, driving, shopping, community activity, and yard work. The patient will benefit from PT to address the limitations/impairments listed below to return to their prior level of function in the domains of activity and participation.    PERSONAL FACTORS: Age and PMH scoliosis >/= 50* curve  are also affecting patient's functional outcome.   REHAB POTENTIAL: Good  CLINICAL DECISION  MAKING: Stable/uncomplicated  EVALUATION COMPLEXITY: Low    GOALS: Goals reviewed with patient? No  SHORT TERM GOALS: Target date: 02/23/2023    1. Patient will be independent with a basic stretching/strengthening HEP  Baseline:  Goal status: INITIAL   LONG TERM GOALS: Target date: 03/16/2023    Patient will be able to score a >/= 65 on the FOTO    to demonstrate an improvement in overall housework, ADL completion, mobility, and self-care.Baseline:  Goal status: INITIAL  2.   Patient will complete the 5 times sit to stand:    within 13 seconds  to demonstrate an improvement lower extremity strength needed for home and community ambulation  Baseline:  Goal status: INITIAL  3.  Patient will be independent with a comprehensive strengthening HEP  Baseline:  Goal status: INITIAL    PLAN:  PT FREQUENCY: 1-2x/week  PT DURATION: 6 weeks  PLANNED INTERVENTIONS: 97110-Therapeutic exercises, 97530- Therapeutic activity, 97112- Neuromuscular re-education, 97535- Self Care, 02859- Manual therapy, [phone]- Gait training, Patient/Family education, Balance training, Stair training, Dry Needling, Joint mobilization, Joint manipulation, Spinal manipulation, Spinal mobilization, Cryotherapy, and Moist heat.  PLAN FOR NEXT SESSION: Progress hip/lumbar strength for scoliosis posture management; add 1-2x therapeutic exercise to [employer], [employer], PTA 02/22/2023, 8:52 AM

## 2023-02-26 ENCOUNTER — Ambulatory Visit (HOSPITAL_COMMUNITY)
Admission: RE | Admit: 2023-02-26 | Discharge: 2023-02-26 | Disposition: A | Discharge status: Home / Self Care | Payer: Medicare Other | Source: Ambulatory Visit | Attending: Family Medicine | Admitting: Family Medicine

## 2023-02-26 ENCOUNTER — Encounter (HOSPITAL_COMMUNITY): Payer: Self-pay

## 2023-02-26 DIAGNOSIS — Z1231 Encounter for screening mammogram for malignant neoplasm of breast: Secondary | ICD-10-CM | POA: Diagnosis not present

## 2023-03-02 ENCOUNTER — Ambulatory Visit (HOSPITAL_COMMUNITY): Payer: Medicare Other | Admitting: Physical Therapy

## 2023-03-02 DIAGNOSIS — R293 Abnormal posture: Secondary | ICD-10-CM

## 2023-03-02 DIAGNOSIS — M25551 Pain in right hip: Secondary | ICD-10-CM | POA: Diagnosis not present

## 2023-03-02 DIAGNOSIS — M5459 Other low back pain: Secondary | ICD-10-CM

## 2023-03-02 NOTE — Therapy (Signed)
[facility]    Patient Name: Shelia Pierce MRN: [id number] DOB:[birth year], 83 y.o., female Today's Date: 03/02/2023  END OF SESSION:   PT End of Session - 03/02/23 1654     Visit Number 6    Number of Visits 8    Date for PT Re-Evaluation 03/16/23    Authorization Type [employer] Medicare    Authorization Time Period 1/17-2/28/25    Authorization - Visit Number 6    Authorization - Number of Visits 8    Progress Note Due on Visit 8    PT Start Time 1600    PT Stop Time 1640    PT Time Calculation (min) 40 min    Activity Tolerance Patient tolerated treatment well    Behavior During Therapy WFL for tasks assessed/performed                 Past Medical History:  Diagnosis Date   Colon polyps    Complication of anesthesia    Diabetes mellitus without complication (HCC)    Diet controlled   Hypercholesteremia    Hypertension    Osteoporosis    PONV (postoperative nausea and vomiting)    Sciatic pain, right    Scoliosis    Past Surgical History:  Procedure Laterality Date   ABDOMINAL HYSTERECTOMY     CATARACT EXTRACTION W/PHACO Left 10/18/2018   Procedure: CATARACT EXTRACTION PHACO AND INTRAOCULAR LENS PLACEMENT  LEFT EYE (CDE: 22.04);  Surgeon: Fabio Pierce, MD;  Location: [facility];  Service: Ophthalmology;  Laterality: Left;   CATARACT EXTRACTION W/PHACO Right 11/15/2018   Procedure: CATARACT EXTRACTION PHACO AND INTRAOCULAR LENS PLACEMENT (IOC) (CDE: 23.55);  Surgeon: Fabio Pierce, MD;  Location: [facility];  Service: Ophthalmology;  Laterality: Right;   COLONOSCOPY     COLONOSCOPY N/A 11/21/2012   Procedure: COLONOSCOPY;  Surgeon: Malissa Hippo, MD;  [facility]: [facility];  Service: Endoscopy;  Laterality: N/A;  1030   COLONOSCOPY N/A 05/25/2017   Procedure: COLONOSCOPY;  Surgeon: Malissa Hippo, MD;  Location: [facility];  Service: Endoscopy;  Laterality: N/A;  830   Left breast biopsy     Left foot     pessary     UMBILICAL HERNIA REPAIR  N/A 06/25/2020   Procedure: UMBILICAL HERNIA REPAIR WITH MESH;  Surgeon: Lucretia Roers, MD;  Location: [facility];  Service: General;  Laterality: N/A;   Patient Active Problem List   Diagnosis Date Noted   Hyperlipemia 12/28/2022   Hyperglycemia due to type 2 diabetes mellitus (HCC) 12/20/2022   Seasonal allergic rhinitis 10/04/2022   Umbilical hernia without obstruction and without gangrene 06/10/2020   Pelvic relaxation 02/14/2019   Rectal bleeding 04/26/2017    PCP: Benita Stabile, MDRef Provider (PCP)   REFERRING PROVIDER:   Vickki Hearing, MD    REFERRING DIAG:  Diagnosis  M54.50 (ICD-10-CM) - Lumbar pain    Rationale for Evaluation and Treatment: Rehabilitation  THERAPY DIAG:  Abnormal posture  Other low back pain  Pain in right hip  ONSET DATE: 1 month ago    SUBJECTIVE:  SUBJECTIVE STATEMENT:  Pt states that she is doing her exercises and is sore.  Not much pain.    Eval:  About 1 month ago, patient reports that her right hip begin to hurt a lot during walking. Patient began to take Tylenol to help ease pain which has been helping. Patient went to Dr [name] 01/25/23. [name] performed x-ray to find 50* curve scoliosis. Patient was unaware she always had scoliosis until this year and has managed it her whole life. Patient referred to [facility] for Rehab.    PERTINENT HISTORY:   HTN  Umbilical hernia repair 2022  DM II - diet managed    PAIN:  Are you having pain? Yes: NPRS scale: 5/10 Pain location: right hip/lumbar Pain description: can be sharp upon walking after rest Aggravating factors: walking after being sedentary Relieving factors: n/a  PRECAUTIONS: None  RED FLAGS: None   WEIGHT BEARING RESTRICTIONS: No  FALLS:  Has patient fallen in last 6 months?  No  LIVING ENVIRONMENT: Lives with: lives with their spouse Lives in: House/apartment Stairs: Yes: External: 5 steps; can reach both Has following equipment at home:  elevated toilet How many hours do you sleep every night: ~6 with some repositioning due to pain   OCCUPATION: retired  PLOF: Independent   PATIENT GOALS: To decrease LBP   NEXT MD VISIT: TBD    OBJECTIVE:   DIAGNOSTIC FINDINGS:  Narrative & Impression  Lumbar 2-3 views   Right hip pain   X-rays show severe scoliosis of the lumbar spine measuring 50 degrees there is degenerative disc narrowing spondylosis   Impression degenerative scoliosis severe 50 degrees   Right hip pain   X-rays show minimal narrowing of the hip joints with normal femoral head no osteophytes no shortening   Impression mild OA both hips PATIENT SURVEYS:  FOTO 59.1600  SCREENING FOR RED FLAGS: Bowel or bladder incontinence: No Spinal tumors: No Cauda equina syndrome: No Compression fracture: No Abdominal aneurysm: No  COGNITION: Overall cognitive status: Within functional limits for tasks assessed  POSTURE: decreased lumbar lordosis, increased thoracic kyphosis, and weight shift left      FUNCTIONAL TESTS:  5 times sit to stand: 14.19 with minimal upper extremity push off legs  .20-29 years: 6.0  1.4 seconds 30-39 years: 6.1  1.4 seconds 40-49 years: 7.6  1.8 seconds 50-59 years: 7.7  2.6 seconds 60-69 years: 8.4  0.0 seconds (female), 12.7  1.8 seconds (female) 70-79 years: 11.6  3.4 seconds (female), 13.0  4.8 seconds (female) 80-89 years: 16.7  4.5 seconds (female), 17.2  5.5 seconds (female) 90+ years: 19.5  2.3 seconds (female), 22.9  9.6 seconds (female)  The Minimal Clinically Important Difference (MCID) is 2.3 seconds.  A score of 16 seconds or less indicates that the participant is not likely to fall.  A score of more than 16 seconds indicates a higher risk of falls  As per American Physical Therapy  Association (APTA) website   GAIT ANALYSIS: Distance walked: 74ft Assistive device utilized: None Level of assistance: Complete Independence Comments: Patient with moderate left lateral trunk lean  SENSATION: WFL   LUMBAR ROM:   AROM eval  Flexion Mid shin   Extension 10  Right lateral flexion Mid thigh  Left lateral flexion Mid thigh   Right rotation   Left rotation    (Blank rows = not tested; * = limited by pain)  LOWER EXTREMITY MMT:    Left hip/knee grossly 3+ thru 4-/5  Right hip/knee grossly 3+  thru 4-/5   LOWER EXTREMITY ROM:     Left hip/knee AROM grossly WFL except:  Right hip/knee AROM grossly WFL except: left knee flexion 0-90 pain   LUMBAR SPECIAL TESTS:  True LLD   Right lower extremity: 80cm   Left lower extremity: 78cm  +patient uses heel lift in left shoe    PALPATION: Moderate tenderness to palpation right hip    TODAY'S TREATMENT:                                                                                                                              DATE:  03/02/23 Wall reach x 10 Decompression 1-5 x 7 each Theraband decompression exercises : overhead, The side pull, ER and SASH x 10 each  Bridge x 10  Dead bug x 10  Side step x 2 RT  Nustep x 5:00 level 2  02/22/23 Heelraises 20X Hip abduction 3# 2X10 Hip extension 3# 2X10 Forward lunge onto 4" box 2X10 each no UE Vectors 2X5 each LE with 1 HHA 5" holds Tidal tank carries 20 ftX 4 RT, SBA  02/13/23 HEP Review/demo- see below  Standing Weighted sled pushes with 60# 23ft x 4 laps, PT contact guard assist Tidal Tank carries 20 ft x 4, PT stand-by assist       PATIENT EDUCATION:  Education details: HEP, sleep positioning  Person educated: Patient Education method: Explanation Education comprehension: verbalized understanding  HOME EXERCISE PROGRAM: Access Code: [email or web address] URL: [email or web address] Date: 02/07/2023 Prepared by: Seymour Bars  Exercises -  Clamshell with Resistance  - 1 x daily - 3 sets - 10 reps - Clamshell with Resistance (Mirrored)  - 1 x daily - 3 sets - 10 reps - Supine March with Resistance Band  - 1 x daily - 3 sets - 10 reps - Supine Bridge with Resistance Band  - 1 x daily - 3 sets - 10 reps   ASSESSMENT:  CLINICAL IMPRESSION:  Therapist reviewed exercises with patient.   Therapist then introduced decompressive exercises to pt HEP. Pt was able to demonstrate good technique of all exercises.  PT continues to have postural dysfunction, decreased strength, and decreased activity tolerance and will continue to benefit from skilled PT.      Patient is a 84 y.o. y.o. female who was seen today for physical therapy evaluation and treatment for abnormal posture, low back pain, right hip pain . Patient presents to PT with the following objective impairments: Abnormal gait, decreased activity tolerance, decreased mobility, difficulty walking, decreased strength, postural dysfunction, and pain. These impairments limit the patient in activities such as carrying, lifting, bending, squatting, sleeping, stairs, and locomotion level. These impairments also limit the patient in participation such as meal prep, cleaning, interpersonal relationship, driving, shopping, community activity, and yard work. The patient will benefit from PT to address the limitations/impairments listed below to return to their prior level of function in the domains of activity and participation.  PERSONAL FACTORS: Age and PMH scoliosis >/= 50* curve  are also affecting patient's functional outcome.   REHAB POTENTIAL: Good  CLINICAL DECISION MAKING: Stable/uncomplicated  EVALUATION COMPLEXITY: Low    GOALS: Goals reviewed with patient? No  SHORT TERM GOALS: Target date: 02/23/2023    1. Patient will be independent with a basic stretching/strengthening HEP  Baseline:  Goal status: INITIAL   LONG TERM GOALS: Target date: 03/16/2023    Patient will  be able to score a >/= 65 on the FOTO    to demonstrate an improvement in overall housework, ADL completion, mobility, and self-care.Baseline:  Goal status: INITIAL  2.   Patient will complete the 5 times sit to stand:    within 13 seconds  to demonstrate an improvement lower extremity strength needed for home and community ambulation  Baseline:  Goal status: INITIAL  3.  Patient will be independent with a comprehensive strengthening HEP  Baseline:  Goal status: INITIAL    PLAN:  PT FREQUENCY: 1-2x/week  PT DURATION: 6 weeks  PLANNED INTERVENTIONS: 97110-Therapeutic exercises, 97530- Therapeutic activity, 97112- Neuromuscular re-education, 97535- Self Care, 82956- Manual therapy, [phone]- Gait training, Patient/Family education, Balance training, Stair training, Dry Needling, Joint mobilization, Joint manipulation, Spinal manipulation, Spinal mobilization, Cryotherapy, and Moist heat.  PLAN FOR NEXT SESSION: Progress hip/lumbar strength for scoliosis posture management; add 1-2x therapeutic exercise to HEP  Virgina Organ, PT CLT [phone]  03/02/2023, 4:55 PM

## 2023-03-06 ENCOUNTER — Ambulatory Visit (HOSPITAL_COMMUNITY): Payer: Medicare Other

## 2023-03-06 DIAGNOSIS — R293 Abnormal posture: Secondary | ICD-10-CM | POA: Diagnosis not present

## 2023-03-06 DIAGNOSIS — M5459 Other low back pain: Secondary | ICD-10-CM

## 2023-03-06 DIAGNOSIS — M25551 Pain in right hip: Secondary | ICD-10-CM

## 2023-03-06 NOTE — Therapy (Signed)
 [facility] OUT[facility] PH[facility]ERAP[facility]    Patient Name: Shelia Pierce MRN: [id number] DOB:Feb 17, 1940, 83 y.o., female Today's Date: 03/06/2023  END OF SESSION:   PT End of Session - 03/06/23 1518     Visit Number 7    Number of Visits 8    Date for PT Re-Evaluation 03/16/23    Authorization Type [employer] Medicare    Authorization Time Period 1/17-2/28/25    Authorization - Visit Number 7    Authorization - Number of Visits 8    Progress Note Due on Visit 8    PT Start Time 1437    PT Stop Time 1515    PT Time Calculation (min) 38 min    Activity Tolerance Patient tolerated treatment well    Behavior During Therapy WFL for tasks assessed/performed                  Past Medical History:  Diagnosis Date   Colon polyps    Complication of anesthesia    Diabetes mellitus without complication (HCC)    Diet controlled   Hypercholesteremia    Hypertension    Osteoporosis    PONV (postoperative nausea and vomiting)    Sciatic pain, right    Scoliosis    Past Surgical History:  Procedure Laterality Date   ABDOMINAL HYSTERECTOMY     CATARACT EXTRACTION W/PHACO Left 10/18/2018   Procedure: CATARACT EXTRACTION PHACO AND INTRAOCULAR LENS PLACEMENT  LEFT EYE (CDE: 22.04);  Surgeon: Fabio Pierce, MD;  Location: [facility];  Service: Ophthalmology;  Laterality: Left;   CATARACT EXTRACTION W/PHACO Right 11/15/2018   Procedure: CATARACT EXTRACTION PHACO AND INTRAOCULAR LENS PLACEMENT (IOC) (CDE: 23.55);  Surgeon: Fabio Pierce, MD;  Location: [facility];  Service: Ophthalmology;  Laterality: Right;   COLONOSCOPY     COLONOSCOPY N/A 11/21/2012   Procedure: COLONOSCOPY;  Surgeon: Malissa Hippo, MD;  [facility]: [facility];  Service: Endoscopy;  Laterality: N/A;  1030   COLONOSCOPY N/A 05/25/2017   Procedure: COLONOSCOPY;  Surgeon: Malissa Hippo, MD;  [facility]: [facility];  Service: Endoscopy;  Laterality: N/A;  830   Left breast biopsy     Left foot     pessary     UMBILICAL HERNIA  REPAIR N/A 06/25/2020   Procedure: UMBILICAL HERNIA REPAIR WITH MESH;  Surgeon: Lucretia Roers, MD;  Location: [facility];  Service: General;  Laterality: N/A;   Patient Active Problem List   Diagnosis Date Noted   Hyperlipemia 12/28/2022   Hyperglycemia due to type 2 diabetes mellitus (HCC) 12/20/2022   Seasonal allergic rhinitis 10/04/2022   Umbilical hernia without obstruction and without gangrene 06/10/2020   Pelvic relaxation 02/14/2019   Rectal bleeding 04/26/2017    PCP: Benita Stabile, MDRef Provider (PCP)   REFERRING PROVIDER:   Vickki Hearing, MD    REFERRING DIAG:  Diagnosis  M54.50 (ICD-10-CM) - Lumbar pain    Rationale for Evaluation and Treatment: Rehabilitation  THERAPY DIAG:  Abnormal posture  Other low back pain  Pain in right hip  ONSET DATE: 1 month ago    SUBJECTIVE:  SUBJECTIVE STATEMENT:  Pt states that she is doing her exercises and is sore in the R hip and pulling in R gluteal area during marching   Eval:  About 1 month ago, patient reports that her right hip begin to hurt a lot during walking. Patient began to take Tylenol to help ease pain which has been helping. Patient went to Dr [name] 01/25/23. [name] performed x-ray to find 50* curve scoliosis. Patient was unaware she always had scoliosis until this year and has managed it her whole life. Patient referred to [facility] for Rehab.    PERTINENT HISTORY:   HTN  Umbilical hernia repair 2022  DM II - diet managed    PAIN:  Are you having pain? Yes: NPRS scale: 5/10 Pain location: right hip/lumbar Pain description: can be sharp upon walking after rest Aggravating factors: walking after being sedentary Relieving factors: n/a  PRECAUTIONS: None  RED FLAGS: None   WEIGHT BEARING RESTRICTIONS:  No  FALLS:  Has patient fallen in last 6 months? No  LIVING ENVIRONMENT: Lives with: lives with their spouse Lives in: House/apartment Stairs: Yes: External: 5 steps; can reach both Has following equipment at home:  elevated toilet How many hours do you sleep every night: ~6 with some repositioning due to pain   OCCUPATION: retired  PLOF: Independent   PATIENT GOALS: To decrease LBP   NEXT MD VISIT: TBD    OBJECTIVE:   DIAGNOSTIC FINDINGS:  Narrative & Impression  Lumbar 2-3 views   Right hip pain   X-rays show severe scoliosis of the lumbar spine measuring 50 degrees there is degenerative disc narrowing spondylosis   Impression degenerative scoliosis severe 50 degrees   Right hip pain   X-rays show minimal narrowing of the hip joints with normal femoral head no osteophytes no shortening   Impression mild OA both hips PATIENT SURVEYS:  FOTO 59.1600  SCREENING FOR RED FLAGS: Bowel or bladder incontinence: No Spinal tumors: No Cauda equina syndrome: No Compression fracture: No Abdominal aneurysm: No  COGNITION: Overall cognitive status: Within functional limits for tasks assessed  POSTURE: decreased lumbar lordosis, increased thoracic kyphosis, and weight shift left      FUNCTIONAL TESTS:  5 times sit to stand: 14.19 with minimal upper extremity push off legs  .20-29 years: 6.0  1.4 seconds 30-39 years: 6.1  1.4 seconds 40-49 years: 7.6  1.8 seconds 50-59 years: 7.7  2.6 seconds 60-69 years: 8.4  0.0 seconds (female), 12.7  1.8 seconds (female) 70-79 years: 11.6  3.4 seconds (female), 13.0  4.8 seconds (female) 80-89 years: 16.7  4.5 seconds (female), 17.2  5.5 seconds (female) 90+ years: 19.5  2.3 seconds (female), 22.9  9.6 seconds (female)  The Minimal Clinically Important Difference (MCID) is 2.3 seconds.  A score of 16 seconds or less indicates that the participant is not likely to fall.  A score of more than 16 seconds indicates a higher  risk of falls  As per American Physical Therapy Association (APTA) website   GAIT ANALYSIS: Distance walked: 69ft Assistive device utilized: None Level of assistance: Complete Independence Comments: Patient with moderate left lateral trunk lean  SENSATION: WFL   LUMBAR ROM:   AROM eval  Flexion Mid shin   Extension 10  Right lateral flexion Mid thigh  Left lateral flexion Mid thigh   Right rotation   Left rotation    (Blank rows = not tested; * = limited by pain)  LOWER EXTREMITY MMT:    Left hip/knee grossly 3+  thru 4-/5  Right hip/knee grossly 3+ thru 4-/5   LOWER EXTREMITY ROM:     Left hip/knee AROM grossly WFL except:  Right hip/knee AROM grossly WFL except: left knee flexion 0-90 pain   LUMBAR SPECIAL TESTS:  True LLD   Right lower extremity: 80cm   Left lower extremity: 78cm  +patient uses heel lift in left shoe    PALPATION: Moderate tenderness to palpation right hip    TODAY'S TREATMENT:                                                                                                                              DATE:  03/06/2023  -Supine Marching x 20  -Isometric deadbug 3 x 10'' with 2lb overhead hold -Supine piriformis stretch 2x 30' -Seated hamstring stretch 2 x 30' -Standing hip abduction isometric 5x5'' with RTB -Attempted lateral side bending x 5 bilaterally with 8lb dumbbell terminated due to too much pain.   03/02/23 Wall reach x 10 Decompression 1-5 x 7 each Theraband decompression exercises : overhead, The side pull, ER and SASH x 10 each  Bridge x 10  Dead bug x 10  Side step x 2 RT  Nustep x 5:00 level 2  02/22/23 Heelraises 20X Hip abduction 3# 2X10 Hip extension 3# 2X10 Forward lunge onto 4" box 2X10 each no UE Vectors 2X5 each LE with 1 HHA 5" holds Tidal tank carries 20 ftX 4 RT, SBA  02/13/23 HEP Review/demo- see below  Standing Weighted sled pushes with 60# 83ft x 4 laps, PT contact guard assist Tidal Tank carries 20 ft  x 4, PT stand-by assist       PATIENT EDUCATION:  Education details: HEP, sleep positioning  Person educated: Patient Education method: Explanation Education comprehension: verbalized understanding  HOME EXERCISE PROGRAM: Access Code: [email or web address] URL: [email or web address] Date: 02/07/2023 Prepared by: Seymour Bars  Exercises - Clamshell with Resistance  - 1 x daily - 3 sets - 10 reps - Clamshell with Resistance (Mirrored)  - 1 x daily - 3 sets - 10 reps - Supine March with Resistance Band  - 1 x daily - 3 sets - 10 reps - Supine Bridge with Resistance Band  - 1 x daily - 3 sets - 10 reps Access Code: [email or web address] URL: [email or web address] Date: 03/06/2023 Prepared by: Starling Manns  Exercises - Supine Figure 4 Piriformis Stretch  - 1 x daily - 7 x weekly - 3 sets - 10 reps - Seated Piriformis Stretch  - 1 x daily - 7 x weekly - 3 sets - 10 reps - Seated Hamstring Stretch  - 1 x daily - 7 x weekly - 3 sets - 10 reps - Standing Hip Abduction with Resistance at Ankles and Counter Support  - 1 x daily - 7 x weekly - 3 sets - 10 reps  ASSESSMENT:  CLINICAL IMPRESSION:    Pt tolerating session well. Given new HEP with daily mobility activities. Pt tolerated new  challenges well with decent carryover. Progress HEP for mobility. PT continues to have postural dysfunction, decreased strength, and decreased activity tolerance and will continue to benefit from skilled PT.      Patient is a 83 y.o. y.o. female who was seen today for physical therapy evaluation and treatment for abnormal posture, low back pain, right hip pain . Patient presents to PT with the following objective impairments: Abnormal gait, decreased activity tolerance, decreased mobility, difficulty walking, decreased strength, postural dysfunction, and pain. These impairments limit the patient in activities such as carrying, lifting, bending, squatting, sleeping, stairs, and locomotion level. These  impairments also limit the patient in participation such as meal prep, cleaning, interpersonal relationship, driving, shopping, community activity, and yard work. The patient will benefit from PT to address the limitations/impairments listed below to return to their prior level of function in the domains of activity and participation.    PERSONAL FACTORS: Age and PMH scoliosis >/= 50* curve  are also affecting patient's functional outcome.   REHAB POTENTIAL: Good  CLINICAL DECISION MAKING: Stable/uncomplicated  EVALUATION COMPLEXITY: Low    GOALS: Goals reviewed with patient? No  SHORT TERM GOALS: Target date: 02/23/2023    1. Patient will be independent with a basic stretching/strengthening HEP  Baseline:  Goal status: INITIAL   LONG TERM GOALS: Target date: 03/16/2023    Patient will be able to score a >/= 65 on the FOTO    to demonstrate an improvement in overall housework, ADL completion, mobility, and self-care.Baseline:  Goal status: INITIAL  2.   Patient will complete the 5 times sit to stand:    within 13 seconds  to demonstrate an improvement lower extremity strength needed for home and community ambulation  Baseline:  Goal status: INITIAL  3.  Patient will be independent with a comprehensive strengthening HEP  Baseline:  Goal status: INITIAL    PLAN:  PT FREQUENCY: 1-2x/week  PT DURATION: 6 weeks  PLANNED INTERVENTIONS: 97110-Therapeutic exercises, 97530- Therapeutic activity, 97112- Neuromuscular re-education, 97535- Self Care, 42595- Manual therapy, [phone]- Gait training, Patient/Family education, Balance training, Stair training, Dry Needling, Joint mobilization, Joint manipulation, Spinal manipulation, Spinal mobilization, Cryotherapy, and Moist heat.  PLAN FOR NEXT SESSION: Progress hip/lumbar strength for scoliosis posture management; add 1-2x therapeutic exercise to HEP    Nelida Meuse PT, DPT [facility]  [place] [phone] office  03/06/2023, 3:19 PM

## 2023-03-07 ENCOUNTER — Encounter (HOSPITAL_COMMUNITY): Payer: Medicare Other | Admitting: Physical Therapy

## 2023-03-14 ENCOUNTER — Ambulatory Visit (HOSPITAL_COMMUNITY): Payer: Medicare Other | Admitting: Physical Therapy

## 2023-03-14 ENCOUNTER — Encounter (HOSPITAL_COMMUNITY): Payer: Self-pay

## 2023-03-14 DIAGNOSIS — R293 Abnormal posture: Secondary | ICD-10-CM | POA: Diagnosis not present

## 2023-03-14 DIAGNOSIS — M5459 Other low back pain: Secondary | ICD-10-CM

## 2023-03-14 DIAGNOSIS — M25551 Pain in right hip: Secondary | ICD-10-CM | POA: Diagnosis not present

## 2023-03-14 NOTE — Therapy (Signed)
 [facility]ERAP[facility]    Patient Name: Shelia Pierce MRN: [id number] DOB:[birth year], 83 y.o., female Today's Date: 03/14/2023  END OF SESSION:   PT End of Session - 03/14/23 0808     Visit Number 8    Number of Visits 8    Date for PT Re-Evaluation 03/16/23    Authorization Type [employer] Medicare    Authorization Time Period 1/17-2/28/25    Authorization - Number of Visits 8    Progress Note Due on Visit 8    PT Start Time 0805    PT Stop Time 0843    PT Time Calculation (min) 38 min    Activity Tolerance Patient tolerated treatment well    Behavior During Therapy WFL for tasks assessed/performed                  Past Medical History:  Diagnosis Date   Colon polyps    Complication of anesthesia    Diabetes mellitus without complication (HCC)    Diet controlled   Hypercholesteremia    Hypertension    Osteoporosis    PONV (postoperative nausea and vomiting)    Sciatic pain, right    Scoliosis    Past Surgical History:  Procedure Laterality Date   ABDOMINAL HYSTERECTOMY     CATARACT EXTRACTION W/PHACO Left 10/18/2018   Procedure: CATARACT EXTRACTION PHACO AND INTRAOCULAR LENS PLACEMENT  LEFT EYE (CDE: 22.04);  Surgeon: Fabio Pierce, MD;  Location: [facility];  Service: Ophthalmology;  Laterality: Left;   CATARACT EXTRACTION W/PHACO Right 11/15/2018   Procedure: CATARACT EXTRACTION PHACO AND INTRAOCULAR LENS PLACEMENT (IOC) (CDE: 23.55);  Surgeon: Fabio Pierce, MD;  Location: [facility];  Service: Ophthalmology;  Laterality: Right;   COLONOSCOPY     COLONOSCOPY N/A 11/21/2012   Procedure: COLONOSCOPY;  Surgeon: Malissa Hippo, MD;  [facility];  Service: Endoscopy;  Laterality: N/A;  1030   COLONOSCOPY N/A 05/25/2017   Procedure: COLONOSCOPY;  Surgeon: Malissa Hippo, MD;  [facility];  Service: Endoscopy;  Laterality: N/A;  830   Left breast biopsy     Left foot     pessary     UMBILICAL HERNIA REPAIR N/A 06/25/2020   Procedure:  UMBILICAL HERNIA REPAIR WITH MESH;  Surgeon: Lucretia Roers, MD;  Location: [facility];  Service: General;  Laterality: N/A;   Patient Active Problem List   Diagnosis Date Noted   Hyperlipemia 12/28/2022   Hyperglycemia due to type 2 diabetes mellitus (HCC) 12/20/2022   Seasonal allergic rhinitis 10/04/2022   Umbilical hernia without obstruction and without gangrene 06/10/2020   Pelvic relaxation 02/14/2019   Rectal bleeding 04/26/2017    PCP: Benita Stabile, MDRef Provider (PCP)   REFERRING PROVIDER:   Vickki Hearing, MD    REFERRING DIAG:  Diagnosis  M54.50 (ICD-10-CM) - Lumbar pain    Rationale for Evaluation and Treatment: Rehabilitation  THERAPY DIAG:  Abnormal posture  Other low back pain  Pain in right hip  ONSET DATE: 1 month ago    SUBJECTIVE:  SUBJECTIVE STATEMENT:  pt states she has a foot bike she's been using at home and doing some of her exercises each day.  Reports she feels like she is over 50% improved.  Still has soreness in the morning but overall she is stronger through her lower back and has learned a lot regarding how to carry items and what to do to keep her mobility up.  Eval:  About 1 month ago, patient reports that her right hip begin to hurt a lot during walking. Patient began to take Tylenol to help ease pain which has been helping. Patient went to Dr [name] 01/25/23. [name] performed x-ray to find 50* curve scoliosis. Patient was unaware she always had scoliosis until this year and has managed it her whole life. Patient referred to [facility] for Rehab.    PERTINENT HISTORY:   HTN  Umbilical hernia repair 2022  DM II - diet managed    PAIN:  Are you having pain? Yes: NPRS scale: 0/10 Pain location: right hip/lumbar Pain description: can be sharp upon  walking after rest Aggravating factors: walking after being sedentary Relieving factors: n/a  PRECAUTIONS: None  RED FLAGS: None   WEIGHT BEARING RESTRICTIONS: No  FALLS:  Has patient fallen in last 6 months? No  LIVING ENVIRONMENT: Lives with: lives with their spouse Lives in: House/apartment Stairs: Yes: External: 5 steps; can reach both Has following equipment at home:  elevated toilet How many hours do you sleep every night: ~6 with some repositioning due to pain   OCCUPATION: retired  PLOF: Independent   PATIENT GOALS: To decrease LBP   NEXT MD VISIT: TBD    OBJECTIVE:   DIAGNOSTIC FINDINGS:  Narrative & Impression  Lumbar 2-3 views   Right hip pain   X-rays show severe scoliosis of the lumbar spine measuring 50 degrees there is degenerative disc narrowing spondylosis   Impression degenerative scoliosis severe 50 degrees   Right hip pain   X-rays show minimal narrowing of the hip joints with normal femoral head no osteophytes no shortening   Impression mild OA both hips PATIENT SURVEYS:  FOTO 2/26: 67%  was 59.1600 at evaluation  SCREENING FOR RED FLAGS: Bowel or bladder incontinence: No Spinal tumors: No Cauda equina syndrome: No Compression fracture: No Abdominal aneurysm: No  COGNITION: Overall cognitive status: Within functional limits for tasks assessed  POSTURE: decreased lumbar lordosis, increased thoracic kyphosis, and weight shift left      FUNCTIONAL TESTS:  5 times sit to stand: 14.19 with minimal upper extremity push off legs  .20-29 years: 6.0  1.4 seconds 30-39 years: 6.1  1.4 seconds 40-49 years: 7.6  1.8 seconds 50-59 years: 7.7  2.6 seconds 60-69 years: 8.4  0.0 seconds (female), 12.7  1.8 seconds (female) 70-79 years: 11.6  3.4 seconds (female), 13.0  4.8 seconds (female) 80-89 years: 16.7  4.5 seconds (female), 17.2  5.5 seconds (female) 90+ years: 19.5  2.3 seconds (female), 22.9  9.6 seconds (female)  The  Minimal Clinically Important Difference (MCID) is 2.3 seconds.  A score of 16 seconds or less indicates that the participant is not likely to fall.  A score of more than 16 seconds indicates a higher risk of falls  As per American Physical Therapy Association (APTA) website   GAIT ANALYSIS: Distance walked: 53ft Assistive device utilized: None Level of assistance: Complete Independence Comments: Patient with moderate left lateral trunk lean  SENSATION: WFL   LUMBAR ROM:   AROM eval 03/14/23  Flexion Mid shin  ankle  Extension 10 10  Right lateral flexion Mid thigh Top of knee  Left lateral flexion Mid thigh  Medial knee  Right rotation    Left rotation     (Blank rows = not tested; * = limited by pain)  LOWER EXTREMITY MMT:    Left hip/knee grossly 3+ thru 4-/5   2/26: 4/5 grossly Right hip/knee grossly 3+ thru 4-/5   2/26: 4/5 grossly  LOWER EXTREMITY ROM:     Left hip/knee AROM grossly WFL except:  Right hip/knee AROM grossly WFL except: left knee flexion 0-90 pain   LUMBAR SPECIAL TESTS:  True LLD   Right lower extremity: 80cm   Left lower extremity: 78cm  +patient uses heel lift in left shoe    PALPATION: Moderate tenderness to palpation right hip    TODAY'S TREATMENT:                                                                                                                              DATE:  03/14/2023 Functional testing:  FOTO  67%  was 59% at evaluation  5X STS: 8.7 sec (was over 13 sec)  ROM testing  MMT LE's Decompression 1-5, 5X5" holds each Goal review; all goals met  03/06/2023  -Supine Marching x 20  -Isometric deadbug 3 x 10'' with 2lb overhead hold -Supine piriformis stretch 2x 30' -Seated hamstring stretch 2 x 30' -Standing hip abduction isometric 5x5'' with RTB -Attempted lateral side bending x 5 bilaterally with 8lb dumbbell terminated due to too much pain.   03/02/23 Wall reach x 10 Decompression 1-5 x 7 each Theraband  decompression exercises : overhead, The side pull, ER and SASH x 10 each  Bridge x 10  Dead bug x 10  Side step x 2 RT  Nustep x 5:00 level 2  02/22/23 Heelraises 20X Hip abduction 3# 2X10 Hip extension 3# 2X10 Forward lunge onto 4" box 2X10 each no UE Vectors 2X5 each LE with 1 HHA 5" holds Tidal tank carries 20 ftX 4 RT, SBA  02/13/23 HEP Review/demo- see below  Standing Weighted sled pushes with 60# 47ft x 4 laps, PT contact guard assist Tidal Tank carries 20 ft x 4, PT stand-by assist       PATIENT EDUCATION:  Education details: HEP, sleep positioning  Person educated: Patient Education method: Explanation Education comprehension: verbalized understanding  HOME EXERCISE PROGRAM: Access Code: [email or web address] URL: [email or web address] Date: 02/07/2023 Prepared by: Seymour Bars  Exercises - Clamshell with Resistance  - 1 x daily - 3 sets - 10 reps - Clamshell with Resistance (Mirrored)  - 1 x daily - 3 sets - 10 reps - Supine March with Resistance Band  - 1 x daily - 3 sets - 10 reps - Supine Bridge with Resistance Band  - 1 x daily - 3 sets - 10 reps Access Code: [email or web address] URL: [email or web address] Date: 03/06/2023 Prepared by: Starling Manns  Exercises - Supine Figure 4 Piriformis  Stretch  - 1 x daily - 7 x weekly - 3 sets - 10 reps - Seated Piriformis Stretch  - 1 x daily - 7 x weekly - 3 sets - 10 reps - Seated Hamstring Stretch  - 1 x daily - 7 x weekly - 3 sets - 10 reps - Standing Hip Abduction with Resistance at Ankles and Counter Support  - 1 x daily - 7 x weekly - 3 sets - 10 reps  03/14/23:  decompression 1-5  5X each  ASSESSMENT:  CLINICAL IMPRESSION:    Progress note completed this session.  Test measures repeated with all goals met.  Discussed HEP and reviewed several she was having difficulty with.  Encouraged to do "something" everyday and not to focus on completing all her exercises everyday as she reported feeling overwhelmed  at times due to all the exercises she has been issued.  Went through decompression exercises specifically with cues for hold times and repetitions. Pt overall pleased with progress made and ready for discharge.    Patient is a 83 y.o. y.o. female who was seen today for physical therapy evaluation and treatment for abnormal posture, low back pain, right hip pain . Patient presents to PT with the following objective impairments: Abnormal gait, decreased activity tolerance, decreased mobility, difficulty walking, decreased strength, postural dysfunction, and pain. These impairments limit the patient in activities such as carrying, lifting, bending, squatting, sleeping, stairs, and locomotion level. These impairments also limit the patient in participation such as meal prep, cleaning, interpersonal relationship, driving, shopping, community activity, and yard work. The patient will benefit from PT to address the limitations/impairments listed below to return to their prior level of function in the domains of activity and participation.    PERSONAL FACTORS: Age and PMH scoliosis >/= 50* curve  are also affecting patient's functional outcome.   REHAB POTENTIAL: Good  CLINICAL DECISION MAKING: Stable/uncomplicated  EVALUATION COMPLEXITY: Low    GOALS: Goals reviewed with patient? No  SHORT TERM GOALS: Target date: 02/23/2023    1. Patient will be independent with a basic stretching/strengthening HEP  Baseline:  Goal status: MET   LONG TERM GOALS: Target date: 03/16/2023    Patient will be able to score a >/= 65 on the FOTO    to demonstrate an improvement in overall housework, ADL completion, mobility, and self-care.Baseline:  Goal status: MET  67%  2.   Patient will complete the 5 times sit to stand:    within 13 seconds  to demonstrate an improvement lower extremity strength needed for home and community ambulation  Baseline:  Goal status: MET 8.7 sec  3.  Patient will be independent  with a comprehensive strengthening HEP  Baseline:  Goal status: MET    PLAN:  PT FREQUENCY: 1-2x/week  PT DURATION: 6 weeks  PLANNED INTERVENTIONS: 97110-Therapeutic exercises, 97530- Therapeutic activity, 97112- Neuromuscular re-education, 97535- Self Care, 28413- Manual therapy, [phone]- Gait training, Patient/Family education, Balance training, Stair training, Dry Needling, Joint mobilization, Joint manipulation, Spinal manipulation, Spinal mobilization, Cryotherapy, and Moist heat.  PLAN FOR NEXT SESSION: DISCHARGE as goals met  Lurena Nida, PTA/CLT [facility] Ph: [phone]  03/14/2023, 8:09 AM

## 2023-03-14 NOTE — Therapy (Signed)
 [facility] Outpatient Rehabilitation at [facility] [address] [place], [place], [address] Phone: [phone]   Fax:  [phone]  Patient Details  Name: ALAYZIAH TANGEMAN MRN: [id number] Date of Birth: 24-Apr-1940 Referring Provider:  No ref. provider found  Encounter Date: 03/14/2023  PHYSICAL THERAPY DISCHARGE SUMMARY  Visits from Start of Care: 8  Current functional level related to goals / functional outcomes: Met all goals.    Remaining deficits: Some morning pain/soreness   Education / Equipment: HEP- see last treatment note this date.    Patient agrees to discharge. Patient goals were met. Patient is being discharged due to being pleased with the current functional level. PT agrees with findings from previous PTA note. Discharging pt from skilled PT at this time.  Nelida Meuse PT, DPT [facility]- [place] 336 [phone] office  Nelida Meuse, [place] 03/14/2023, 4:12 PM  [facility] Outpatient Rehabilitation at [address] [facility] [address] [place], [place], [address] Phone: [phone]   Fax:  [phone]

## 2023-04-30 ENCOUNTER — Ambulatory Visit: Admitting: Obstetrics & Gynecology

## 2023-04-30 ENCOUNTER — Encounter: Payer: Self-pay | Admitting: Obstetrics & Gynecology

## 2023-04-30 VITALS — BP 165/100 | HR 88 | Ht 60.0 in | Wt 158.0 lb

## 2023-04-30 DIAGNOSIS — N993 Prolapse of vaginal vault after hysterectomy: Secondary | ICD-10-CM

## 2023-04-30 DIAGNOSIS — Z87440 Personal history of urinary (tract) infections: Secondary | ICD-10-CM

## 2023-04-30 DIAGNOSIS — Z4689 Encounter for fitting and adjustment of other specified devices: Secondary | ICD-10-CM

## 2023-04-30 LAB — POCT URINALYSIS DIPSTICK
Glucose, UA: NEGATIVE
Ketones, UA: NEGATIVE
Nitrite, UA: NEGATIVE
Protein, UA: POSITIVE — AB

## 2023-04-30 NOTE — Progress Notes (Signed)
 Chief Complaint  Patient presents with   Pessary Check    Blood pressure (!) 162/100, pulse 88, height 5' (1.524 m), weight 158 lb (71.7 kg).  Shelia Pierce presents today for routine follow up related to her pessary.   She uses a Milex ring with support #5 She reports no vaginal discharge and no vaginal bleeding   Likert scale(1 not bothersome -5 very bothersome)  :  1  Exam reveals no undue vaginal mucosal pressure of breakdown, no discharge and no vaginal bleeding.  Vaginal Epithelial Abnormality Classification System:   0 0    No abnormalities 1    Epithelial erythema 2    Granulation tissue 3    Epithelial break or erosion, 1 cm or less 4    Epithelial break or erosion, 1 cm or greater  The pessary is removed, cleaned and replaced without difficulty.      ICD-10-CM   1. Pessary maintenance Milex ring with support #5  Z46.89     2. 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 History of UTI  Z87.440 POCT Urinalysis Dipstick       Shelia Pierce will be sen back in 3 months for continued follow up.  Wendelyn Halter, MD  04/30/2023 10:47 AM

## 2023-05-08 DIAGNOSIS — M419 Scoliosis, unspecified: Secondary | ICD-10-CM | POA: Diagnosis not present

## 2023-05-15 DIAGNOSIS — E119 Type 2 diabetes mellitus without complications: Secondary | ICD-10-CM | POA: Diagnosis not present

## 2023-05-21 ENCOUNTER — Other Ambulatory Visit: Payer: Self-pay | Admitting: Obstetrics & Gynecology

## 2023-06-07 DIAGNOSIS — M25551 Pain in right hip: Secondary | ICD-10-CM | POA: Diagnosis not present

## 2023-06-07 DIAGNOSIS — N390 Urinary tract infection, site not specified: Secondary | ICD-10-CM | POA: Diagnosis not present

## 2023-06-18 DIAGNOSIS — E1165 Type 2 diabetes mellitus with hyperglycemia: Secondary | ICD-10-CM | POA: Diagnosis not present

## 2023-06-18 DIAGNOSIS — R300 Dysuria: Secondary | ICD-10-CM | POA: Diagnosis not present

## 2023-06-18 DIAGNOSIS — I10 Essential (primary) hypertension: Secondary | ICD-10-CM | POA: Diagnosis not present

## 2023-06-18 DIAGNOSIS — M810 Age-related osteoporosis without current pathological fracture: Secondary | ICD-10-CM | POA: Diagnosis not present

## 2023-06-21 DIAGNOSIS — Z713 Dietary counseling and surveillance: Secondary | ICD-10-CM | POA: Diagnosis not present

## 2023-06-21 DIAGNOSIS — N390 Urinary tract infection, site not specified: Secondary | ICD-10-CM | POA: Diagnosis not present

## 2023-06-21 DIAGNOSIS — E1165 Type 2 diabetes mellitus with hyperglycemia: Secondary | ICD-10-CM | POA: Diagnosis not present

## 2023-06-21 DIAGNOSIS — I10 Essential (primary) hypertension: Secondary | ICD-10-CM | POA: Diagnosis not present

## 2023-06-21 DIAGNOSIS — E785 Hyperlipidemia, unspecified: Secondary | ICD-10-CM | POA: Diagnosis not present

## 2023-06-21 DIAGNOSIS — M810 Age-related osteoporosis without current pathological fracture: Secondary | ICD-10-CM | POA: Diagnosis not present

## 2023-06-21 DIAGNOSIS — G894 Chronic pain syndrome: Secondary | ICD-10-CM | POA: Diagnosis not present

## 2023-06-21 DIAGNOSIS — Z79899 Other long term (current) drug therapy: Secondary | ICD-10-CM | POA: Diagnosis not present

## 2023-06-21 DIAGNOSIS — K449 Diaphragmatic hernia without obstruction or gangrene: Secondary | ICD-10-CM | POA: Diagnosis not present

## 2023-06-21 DIAGNOSIS — I700 Atherosclerosis of aorta: Secondary | ICD-10-CM | POA: Diagnosis not present

## 2023-07-12 ENCOUNTER — Ambulatory Visit: Admitting: Orthopedic Surgery

## 2023-07-12 VITALS — Ht 60.0 in | Wt 156.0 lb

## 2023-07-12 DIAGNOSIS — M5431 Sciatica, right side: Secondary | ICD-10-CM

## 2023-07-12 DIAGNOSIS — M545 Low back pain, unspecified: Secondary | ICD-10-CM | POA: Diagnosis not present

## 2023-07-12 NOTE — Progress Notes (Signed)
 Follow-up scoliosis right lower back pain right hip pain  The patient has gone to physical therapy she has no improvement gait  Still complains of right pain which is increased when she is lying down she has trouble after sitting for long periods and getting up she has pain when she gets up at night  She is on meloxicam  twice a day now recently increased by primary care she also takes Tylenol   She would like to remain active  Chief Complaint  Patient presents with   Hip Pain    R groin and deep in glut on and off but does feel that it's worse and usually the first few steps in the morning are painful does wake her up out of her sleep.     She has a about 1/2 inch leg length discrepancy and left to right foot and 1/2 inch block of the left leg felt  Most of her pain is in her right buttocks ongoing treatments for a right nerve injection  Return as needed

## 2023-07-12 NOTE — Progress Notes (Signed)
   Ht 5' (1.524 m)   Wt 156 lb (70.8 kg)   BMI 30.47 kg/m   Body mass index is 30.47 kg/m.  Chief Complaint  Patient presents with   Hip Pain    R groin and deep in glut on and off but does feel that it's worse and usually the first few steps in the morning are painful does wake her up out of her sleep.     No diagnosis found.  DOI/DOS/ Date: Jan '25  Worse

## 2023-07-12 NOTE — Patient Instructions (Signed)
 We are referring you to [facility] from [facility] address is [address] [place] The phone number is [phone]  The office will call you with an appointment Dr. Burnetta

## 2023-07-27 ENCOUNTER — Encounter: Payer: Self-pay | Admitting: Sports Medicine

## 2023-07-27 ENCOUNTER — Ambulatory Visit: Admitting: Sports Medicine

## 2023-07-27 ENCOUNTER — Other Ambulatory Visit: Payer: Self-pay

## 2023-07-27 DIAGNOSIS — M810 Age-related osteoporosis without current pathological fracture: Secondary | ICD-10-CM

## 2023-07-27 DIAGNOSIS — M25551 Pain in right hip: Secondary | ICD-10-CM | POA: Diagnosis not present

## 2023-07-27 DIAGNOSIS — G8929 Other chronic pain: Secondary | ICD-10-CM

## 2023-07-27 DIAGNOSIS — M1611 Unilateral primary osteoarthritis, right hip: Secondary | ICD-10-CM

## 2023-07-27 DIAGNOSIS — M419 Scoliosis, unspecified: Secondary | ICD-10-CM

## 2023-07-27 DIAGNOSIS — G5701 Lesion of sciatic nerve, right lower limb: Secondary | ICD-10-CM

## 2023-07-27 MED ORDER — METHYLPREDNISOLONE ACETATE 40 MG/ML IJ SUSP
40.0000 mg | INTRAMUSCULAR | Status: AC | PRN
  Administered 2023-07-27: 40 mg via INTRA_ARTICULAR

## 2023-07-27 MED ORDER — LIDOCAINE HCL 1 % IJ SOLN
3.0000 mL | INTRAMUSCULAR | Status: AC | PRN
Start: 2023-07-27 — End: 2023-07-27
  Administered 2023-07-27: 3 mL

## 2023-07-27 MED ORDER — BUPIVACAINE HCL 0.25 % IJ SOLN
3.0000 mL | INTRAMUSCULAR | Status: AC | PRN
  Administered 2023-07-27: 3 mL via INTRA_ARTICULAR

## 2023-07-27 NOTE — Progress Notes (Signed)
 Patient says that she has had pain in the right hip for about 8-9 months now. She points to the posterior hip, and describes her pain as wrapping around the side and through the front of the hip. She says that she will also have pain down the side of the leg that stops at the knee, and is sometimes tender if she rubs it. She denies any pain that goes down the back of the leg, and also denies any numbness or tingling in the leg. She does mention having scoliosis, and that her left leg is shorter than the right.

## 2023-07-27 NOTE — Progress Notes (Signed)
 Shelia Pierce - 83 y.o. female MRN [id number]  Date of birth: [birth year]  Office Visit Note: Visit Date: 07/27/2023 PCP: Shona Norleen PEDLAR, MD Referred by: Margrette Taft BRAVO, MD  Subjective: Chief Complaint  Patient presents with   Right Hip - Pain   HPI: Shelia Pierce is a pleasant 83 y.o. female who presents today for acute on chronic right posterior hip pain, low back pain with scoliosis and occasional radicular pains of the right leg.  She has had pain in the right hip posteriorly for the last 8-9 months.  Here more recently she has been having pain that wraps around the lateral side and into the groin of the right hip.  She will have some radiating pain down the lateral side that stops at about the level of the knee.  She denies any specific numbness or tingling.  She does have a history of significant scoliosis, she was not treated for this as a child.  Her daughter is here today who does state that she herself had adolescent scoliosis and was braced as a child.  Mithra has been in a heel lift to help correct her leg length discrepancy but still has pain about the low back as well as the hip.  He was previously seen in [place] and thought to have some sciatic nerve irritation, sent here for further evaluation and workup of possible injection.  For pain control, she is using meloxicam  7.5 mg which she has increased recently from once daily to twice daily.  She has a notable history of osteoporosis and has been on Fosamax 70 mg once weekly for few years.  Recently saw her primary care physician who took her off of this medication.  Lab Results  Component Value Date   HGBA1C 6.0 (H) 06/22/2020   Pertinent ROS were reviewed with the patient and found to be negative unless otherwise specified above in HPI.   Assessment & Plan: Visit Diagnoses:  1. Sciatic neuropathy, right   2. Scoliosis of thoracolumbar spine, unspecified scoliosis type   3. Chronic right hip pain   4.  Unilateral primary osteoarthritis, right hip   5. Age-related osteoporosis without current pathological fracture    Plan: Impression is chronic and progressive right posterior hip, groin and posterior leg pain.  This is in the setting of significant thoracolumbar scoliosis with a lumbar levoscoliosis and functional leg leg deficit.  I do think her scoliosis is the origin of her pain and manifestation of additional conditions.  She does have mild osteoarthritis about the hip joint as well and functional sciatic neuropathy with pain in the buttock rating down the posterior leg.  We discussed treating this on a diagnostic and therapeutic regimen.  We did proceed with posterior hip sciatic nerve Hydro dissection injection today.  Advised on postinjection protocol.  She may use ice/heat for pain control.  Also will continue her meloxicam  7.5 mg twice daily.  I would like to see over the next 2-3 weeks to what degree and percentage of improvement this provides her.  We will follow-up in about 3 weeks.  We did discuss also considering an injection into the intra-articular hip if for some reason today's injection does not give her significant relief.  She and her daughter are understandable and agreeable for this.   Additional treatment considerations may include further imaging of the lumbar spine including CT scan (to evaluate scoliosis and disc height/compression loss)  I did review her previous DEXA scan from 02/20/2022, she  does meet criteria for osteoporosis.  Her most significant bone degradation is in the left radius with a T-score of -2.9.  I do think she would benefit from further management and osteoporosis.  She has been on Fosamax in the past, but would likely benefit from an anabolic agent or at least Prolia injections.  She will discuss this with her PCP, although I did discuss referral to [facility] for our osteoporosis clinic here if she desires.  Follow-up: Return in about 3 weeks (around  08/17/2023) for 30-min visit - f/u R-hip/Sciatic nerve (poss hip inj).   Meds & Orders: No orders of the defined types were placed in this encounter.   Orders Placed This Encounter  Procedures   Large Joint Inj   US  Guided Needle Placement - No Linked Charges     Procedures: Large Joint Inj: R hip joint on 07/27/2023 2:24 PM Indications: pain Details: 22 G 3.5 in needle, ultrasound-guided posterior approach Medications: 3 mL lidocaine  1 %; 40 mg methylPREDNISolone  acetate 40 MG/ML; 3 mL bupivacaine  0.25 % Outcome: tolerated well, no immediate complications  US -guided Posterior hip injection with sciatic nerve hydrodissection (neurolysis), right leg: After discussion on risks/benefits/indications, informed verbal consent was obtained. A timeout was then performed. Patient was placed in prone position on the table in exam room with affected leg facing upward in a neutral position. The overlying area was prepped with ChloraPrep and multiple alcohol swabs. Utilizing ultrasound guidance, the sciatic nerve and surrounding neurovasculature was identified. The overlying soft tissue was first anesthetized with 2 cc of lidocaine  1%.  Following this, using ultrasound guidance via an in-plane approach, a 22-gauge, 3.5 inch needle was inserted from a lateral to medial direction around the nerve. Using a mixture of 3:3:1 cc's of lidocaine :bupivicaine::methylprednisolone , the nerve was hydrodissected. The needle was inserted both above and below the nerve with ultrasound visualization of 360 degrees spread and appropriate hydrodissection and lysis of the nerve. Patient tolerated the procedure well without immediate complication.  Band-Aid was applied.  Procedure, treatment alternatives, risks and benefits explained, specific risks discussed. Consent was given by the patient. Immediately prior to procedure a time out was called to verify the correct patient, procedure, equipment, support staff and site/side marked  as required. Patient was prepped and draped in the usual sterile fashion.          Clinical History: No specialty comments available.  She reports that she has never smoked. She has never used smokeless tobacco. No results for input(s): HGBA1C, LABURIC in the last 8760 hours.  Narrative & Impression  EXAM: DUAL X-RAY ABSORPTIOMETRY (DXA) FOR BONE MINERAL DENSITY   IMPRESSION: Your patient Shelia Pierce completed a BMD test on 02/20/2022 using the [employer] DXA System (software version: 14.10) manufactured by [employer]. The following summarizes the results of our evaluation. Technologist: [facility]   PATIENT BIOGRAPHICAL: Name: Shelia Pierce, Shelia Pierce Patient ID:  [id number]   Birth Date: December 28, 1940 Height:     60.0 in. Gender:      Female   Exam Date:  02/20/2022 Weight:     147.5 lbs. Indications: Parent Hip Fracture, Partial Hysterectomy, Post Menopausal, Height Loss, Follow up Osteoporosis, Caucasian, History of Fracture (Adult) Fractures: Wrist Treatments: Calcium, Fosamax, Multivitamin, Vitamin D   DENSITOMETRY RESULTS: Site         Region     Measured Date Measured Age WHO Classification Young Adult T-score BMD         %Change vs. Previous Significant Change (*) DualFemur  Total Left 02/20/2022 79.3 Osteoporosis -2.5 0.699 g/cm2 -5.2% Yes DualFemur Total Left 02/11/2020 77.3 Osteopenia -2.2 0.737 g/cm2 -10.3% Yes DualFemur Total Left 05/02/2017 74.5 Osteopenia -1.5 0.822 g/cm2 -2.1% - DualFemur Total Left 07/29/2014 71.7 Osteopenia -1.3 0.840 g/cm2 -2.6% - DualFemur Total Left 06/28/2011 68.6 Osteopenia -1.2 0.862 g/cm2 4.6% Yes DualFemur Total Left 04/27/2009 66.5 Osteopenia -1.5 0.824 g/cm2 4.3% Yes DualFemur Total Left 05/01/2007 64.5 Osteopenia -1.7 0.790 g/cm2 - -   DualFemur Total Mean 02/20/2022 79.3 Osteopenia -2.0 0.761 g/cm2 -1.9% - DualFemur Total Mean 02/11/2020 77.3 Osteopenia -1.8 0.776 g/cm2 -5.4% Yes DualFemur Total Mean  05/02/2017 74.5 Osteopenia -1.5 0.820 g/cm2 -2.1% - DualFemur Total Mean 07/29/2014 71.7 Osteopenia -1.3 0.838 g/cm2 -2.0% - DualFemur Total Mean 06/28/2011 68.6 Osteopenia -1.2 0.855 g/cm2 3.1% Yes DualFemur Total Mean 04/27/2009 66.5 Osteopenia -1.4 0.829 g/cm2 5.1% Yes DualFemur Total Mean 05/01/2007 64.5 Osteopenia -1.7 0.789 g/cm2 - -   Left Forearm Radius 33% 02/20/2022 79.3 Osteoporosis -2.8 0.512 g/cm2 1.9% - Left Forearm Radius 33% 02/11/2020 77.3 Osteoporosis -3.0 0.503 g/cm2 -1.6% - Left Forearm Radius 33% 05/02/2017 74.5 Osteoporosis -2.8 0.511 g/cm2 -1.1% - Left Forearm Radius 33% 07/29/2014 71.7 Osteoporosis -2.8 0.516 g/cm2 1.7% - Left Forearm Radius 33% 06/28/2011 68.6 Osteoporosis -2.9 0.508 g/cm2 -4.0% - Left Forearm Radius 33% 04/27/2009 66.5 Osteoporosis -2.6 0.528 g/cm2 3.6% - Left Forearm Radius 33% 05/01/2007 64.5 Osteoporosis -2.9 0.510 g/cm2 - -   ASSESSMENT: The BMD measured at Forearm Radius 33% is 0.512 g/cm2 with a T-score of -2.8. This patient is considered osteoporotic according to World Health Organization [facility]) criteria. The scan quality is good. Compared with the prior study on 02/11/20, the BMD of the total mean shows no statistically significant change. Lumbar spine was excluded due to advanced degenerative changes.   World Science writer [employer]) criteria for post-menopausal, Caucasian Women: Normal:       T-score at or above -1 SD Osteopenia:   T-score between -1 and -2.5 SD Osteoporosis: T-score at or below -2.5 SD   RECOMMENDATIONS: 1. All patients should optimize calcium and vitamin D intake. 2. Consider FDA-approved medical therapies in postmenopausal women and med aged 49 years and older, based on the following: a. A hip or vertebral (clinical or morphometric) fracture b. T-score< -2.5 at the femoral neck or spine after appropriate evaluation to exclude secondary causes c. Low bone mass (T-score between -1.0 and -2.5 at the  femoral neck or spine) and a 10-year probability of a hip fracture > 3% or a 10-year probability of a major osteoporosis-related fracture > 20% based on the [place] -adapted WHO algorithm d. Clinician judgment and/or patient preferences may indicate treatment for people with 10-year fracture probabilities above or below these levels    Objective:    Physical Exam  Gen: Well-appearing, in no acute distress; non-toxic CV: Well-perfused. Warm.  Resp: Breathing unlabored on room air; no wheezing. Psych: Fluid speech in conversation; appropriate affect; normal thought process  Ortho Exam - Lumbar: There is advanced thoracolumbar scoliosis with asymmetric iliac crest heights as well as a functional leg leg deficit.  There is limited range of motion about the lumbar spine.  - Right hip: There is tenderness to palpation both in the posterior hip, the posterior buttock near the piriformis and exiting sciatic nerve.  There is some tenderness about the SI joint posteriorly as well.  There is pain with FADIR and FABER testing.  No significant restriction with internal and external logroll.  Imaging:  *Independent review and interpretation of AP  pelvis as well as 3 view lumbar x-ray from 01/25/2023 was performed by myself today.  AP film of the pelvis demonstrates mild osteoarthritic change of bilateral hip joints.  There is no collapse of the femoral head and neck junction.  Lumbar x-rays demonstrate a severe S-curve scoliosis with levoscoliosis of the lumbar spine that is at least 50 degrees.  There is significant lumbar degenerative disc disease with notable facet hypertrophy.  Aortic calcification also noted.  01/25/23: Narrative & Impression  AP pelvis   Right hip pain   X-rays show minimal narrowing of the hip joints with normal femoral head no osteophytes no shortening   Impression mild OA both hips     Narrative & Impression  Lumbar 2-3 views   Right hip pain   X-rays show severe scoliosis  of the lumbar spine measuring 50 degrees there is degenerative disc narrowing spondylosis   Impression degenerative scoliosis severe 50 degrees      Past Medical/Family/Surgical/Social History: Medications & Allergies reviewed per EMR, new medications updated. Patient Active Problem List   Diagnosis Date Noted   Hyperlipemia 12/28/2022   Hyperglycemia due to type 2 diabetes mellitus (HCC) 12/20/2022   Seasonal allergic rhinitis 10/04/2022   Umbilical hernia without obstruction and without gangrene 06/10/2020   Pelvic relaxation 02/14/2019   Rectal bleeding 04/26/2017   Past Medical History:  Diagnosis Date   Colon polyps    Complication of anesthesia    Diabetes mellitus without complication (HCC)    Diet controlled   Hypercholesteremia    Hypertension    Osteoporosis    PONV (postoperative nausea and vomiting)    Sciatic pain, right    Scoliosis    Family History  Problem Relation Age of Onset   Other Mother        bladder infection   Colon cancer Neg Hx    Past Surgical History:  Procedure Laterality Date   ABDOMINAL HYSTERECTOMY     CATARACT EXTRACTION W/PHACO Left 10/18/2018   Procedure: CATARACT EXTRACTION PHACO AND INTRAOCULAR LENS PLACEMENT  LEFT EYE (CDE: 22.04);  Surgeon: Harrie Agent, MD;  Location: [facility];  Service: Ophthalmology;  Laterality: Left;   CATARACT EXTRACTION W/PHACO Right 11/15/2018   Procedure: CATARACT EXTRACTION PHACO AND INTRAOCULAR LENS PLACEMENT (IOC) (CDE: 23.55);  Surgeon: Harrie Agent, MD;  Location: [facility];  Service: Ophthalmology;  Laterality: Right;   COLONOSCOPY     COLONOSCOPY N/A 11/21/2012   Procedure: COLONOSCOPY;  Surgeon: Claudis RAYMOND Rivet, MD;  Location: [facility];  Service: Endoscopy;  Laterality: N/A;  1030   COLONOSCOPY N/A 05/25/2017   Procedure: COLONOSCOPY;  Surgeon: Rivet Claudis RAYMOND, MD;  Location: [facility];  Service: Endoscopy;  Laterality: N/A;  830   Left breast biopsy     Left foot     pessary      UMBILICAL HERNIA REPAIR N/A 06/25/2020   Procedure: UMBILICAL HERNIA REPAIR WITH MESH;  Surgeon: Kallie Manuelita BROCKS, MD;  Location: [facility];  Service: General;  Laterality: N/A;   Social History   Occupational History   Not on file  Tobacco Use   Smoking status: Never   Smokeless tobacco: Never  Vaping Use   Vaping status: Never Used  Substance and Sexual Activity   Alcohol use: No   Drug use: No   Sexual activity: Not Currently    Birth control/protection: Surgical    Comment: hyst

## 2023-08-07 ENCOUNTER — Ambulatory Visit: Admitting: Obstetrics & Gynecology

## 2023-08-07 ENCOUNTER — Encounter: Payer: Self-pay | Admitting: Obstetrics & Gynecology

## 2023-08-07 VITALS — BP 172/94 | HR 68 | Ht 60.0 in | Wt 154.0 lb

## 2023-08-07 DIAGNOSIS — Z4689 Encounter for fitting and adjustment of other specified devices: Secondary | ICD-10-CM

## 2023-08-07 DIAGNOSIS — N993 Prolapse of vaginal vault after hysterectomy: Secondary | ICD-10-CM | POA: Diagnosis not present

## 2023-08-07 NOTE — Progress Notes (Signed)
 Chief Complaint  Patient presents with   Pessary Check    Blood pressure (!) 153/81, pulse 64, height 5' (1.524 m), weight 154 lb (69.9 kg).  Shelia Pierce presents today for routine follow up related to her pessary.   She uses a Milex ring with support #5 She reports no vaginal discharge and no vaginal bleeding   Likert scale(1 not bothersome -5 very bothersome)  :  1  Exam reveals no undue vaginal mucosal pressure of breakdown, no discharge and no vaginal bleeding.  Vaginal Epithelial Abnormality Classification System:   0 0    No abnormalities 1    Epithelial erythema 2    Granulation tissue 3    Epithelial break or erosion, 1 cm or less 4    Epithelial break or erosion, 1 cm or greater  The pessary is removed, cleaned and replaced without difficulty.      ICD-10-CM   1. Pessary maintenance Milex ring with support #5  Z46.89     2. 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  Vonn VEAR Inch, MD  08/07/2023 3:28 PM

## 2023-08-17 ENCOUNTER — Ambulatory Visit: Admitting: Sports Medicine

## 2023-08-17 ENCOUNTER — Encounter: Payer: Self-pay | Admitting: Sports Medicine

## 2023-08-17 ENCOUNTER — Other Ambulatory Visit: Payer: Self-pay

## 2023-08-17 DIAGNOSIS — M419 Scoliosis, unspecified: Secondary | ICD-10-CM

## 2023-08-17 DIAGNOSIS — M810 Age-related osteoporosis without current pathological fracture: Secondary | ICD-10-CM

## 2023-08-17 DIAGNOSIS — M25551 Pain in right hip: Secondary | ICD-10-CM | POA: Diagnosis not present

## 2023-08-17 DIAGNOSIS — M51362 Other intervertebral disc degeneration, lumbar region with discogenic back pain and lower extremity pain: Secondary | ICD-10-CM

## 2023-08-17 DIAGNOSIS — G8929 Other chronic pain: Secondary | ICD-10-CM | POA: Diagnosis not present

## 2023-08-17 DIAGNOSIS — M1611 Unilateral primary osteoarthritis, right hip: Secondary | ICD-10-CM

## 2023-08-17 MED ORDER — METHYLPREDNISOLONE ACETATE 40 MG/ML IJ SUSP
40.0000 mg | INTRAMUSCULAR | Status: AC | PRN
  Administered 2023-08-17: 40 mg via INTRA_ARTICULAR

## 2023-08-17 MED ORDER — LIDOCAINE HCL 1 % IJ SOLN
4.0000 mL | INTRAMUSCULAR | Status: AC | PRN
  Administered 2023-08-17: 4 mL

## 2023-08-17 NOTE — Progress Notes (Signed)
 Shelia Pierce - 83 y.o. female MRN [id number]  Date of birth: [birth year]  Office Visit Note: Visit Date: 08/17/2023 PCP: Shona Norleen PEDLAR, MD Referred by: Shona Norleen PEDLAR, MD  Subjective: Chief Complaint  Patient presents with   Right Hip - Follow-up   HPI: Shelia Pierce is a pleasant 83 y.o. female who presents today for follow-up of chronic low back pain and right hip/leg pain.  She really is doing better but her pain is still not resolved.  She does know that she has rather advanced scoliosis.  We did perform a sciatic nerve Hydro dissection over the posterior right hip and buttock at last visit and she feels like she is about 50% improved from this.  She is not having any of the posterior pain that goes down the leg.  She is getting more pain in the groin and over the anterior lateral hip.  She gets pain in the morning when she first gets up as well as transferring.  She is using Tylenol  500 mg twice daily as well as meloxicam  7.5 twice daily.  She does occasionally get numbness and tingling that radiates down the lateral leg and into the anterior shin.  This tingling is not present all the time.  She does have a question regarding her osteoporosis as she has a previous T-score of -2.9, and had been managed by her primary care in the past.  She was on Fosamax in the past but is not on anything currently.  She is asking about other treatment options that she can discuss with her PCP.  She is not interested in referral to our osteoporosis clinic here at this time.  Pertinent ROS were reviewed with the patient and found to be negative unless otherwise specified above in HPI.   Assessment & Plan: Visit Diagnoses:  1. Chronic right hip pain   2. Greater trochanteric pain syndrome of right lower extremity   3. Unilateral primary osteoarthritis, right hip   4. Scoliosis of thoracolumbar spine, unspecified scoliosis type   5. Degeneration of intervertebral disc of lumbar region with discogenic  back pain and lower extremity pain   6. Age-related osteoporosis without current pathological fracture    Plan: Surely is dealing with both low back pain from advanced degenerative thoracolumbar scoliosis and OA as well as right hip OA and lateral gluteal insufficiency.  I do think these 3 things are contributing to her pain and symptoms.  More of her pain is within the groin and over the greater trochanteric region today.  For both diagnostic and therapeutic purposes, we did proceed with ultrasound-guided right intra-articular hip injection.  Landmark guided GT injection also provided today.  Advised on postinjection protocol.  She may use ice/heat or topical medications.  She may continue Tylenol  twice daily as well as her meloxicam  7.5 mg twice daily.  I would like for her to message or call in about 2 weeks for an update in terms of the degree of her pain and the location.  If she is still having radiating pain or pain localized to the low back, next step may be considering MRI of the lumbar spine and/or sending her to Dr. Georgina for further evaluation.  I did write down additional treatment medications for osteoporosis that she may discuss with her PCP such as alendronate/Fosamax, also would like her to look into Prolia, Evenity, and Tymlos (IM injection therapy).   Follow-up: Return for will give me update in 2-3 weeks of improvement.  Meds & Orders: No orders of the defined types were placed in this encounter.   Orders Placed This Encounter  Procedures   Large Joint Inj   US  Guided Needle Placement - No Linked Charges     Procedures: Large Joint Inj: R hip joint on 08/17/2023 2:44 PM Indications: pain Details: 22 G 3.5 in needle, ultrasound-guided anterior approach Medications: 4 mL lidocaine  1 %; 40 mg methylPREDNISolone  acetate 40 MG/ML Outcome: tolerated well, no immediate complications  Procedure: US -guided intra-articular hip injection, Right After discussion on  risks/benefits/indications and informed verbal consent was obtained, a timeout was performed. Patient was lying supine on exam table. The hip was cleaned with betadine  and alcohol swabs. Then utilizing ultrasound guidance, the patient's femoral head and neck junction was identified and subsequently injected with 4:1 lidocaine :depomedrol via an in-plane approach with ultrasound visualization of the injectate administered into the hip joint. Patient tolerated procedure well without immediate complications.  Procedure, treatment alternatives, risks and benefits explained, specific risks discussed. Consent was given by the patient. Immediately prior to procedure a time out was called to verify the correct patient, procedure, equipment, support staff and site/side marked as required. Patient was prepped and draped in the usual sterile fashion.          Greater Trochanteric Bursa Injection, Right After discussion on risk/benefits/indications, an informed verbal consent was obtained and a timeout was performed. The patient was lying in lateral recumbent position on exam table. The area overlying the trochanteric bursa was prepped with Chloraprep and alcohol swab then injected with 2:2:1 lidocaine :bupivicaine:celestone . Patient tolerated procedure well without immediate complications.   Clinical History: No specialty comments available.  She reports that she has never smoked. She has never used smokeless tobacco. No results for input(s): HGBA1C, LABURIC in the last 8760 hours.  Objective:    Physical Exam  Gen: Well-appearing, in no acute distress; non-toxic CV: Well-perfused. Warm.  Resp: Breathing unlabored on room air; no wheezing. Psych: Fluid speech in conversation; appropriate affect; normal thought process  Ortho Exam - Lumbar: There is rather significant thoracolumbar scoliosis with a levoscoliotic curve.  This does cause asymmetric iliac crest heights and somatic dysfunction in the SI  joint and lower lumbar paraspinals.  There is limited flexion and extension about the lumbar spine.  - Right hip: + TTP over the greater trochanteric region bilaterally although right greater than left.  There is about 5 degrees less of internal rotation on the right hip compared to the left.  Positive Stinchfield, mildly positive FADIR and FABER testing.   Imaging:  01/25/23:  Narrative & Impression  Lumbar 2-3 views   Right hip pain   X-rays show severe scoliosis of the lumbar spine measuring 50 degrees there is degenerative disc narrowing spondylosis   Impression degenerative scoliosis severe 50 degrees   Past Medical/Family/Surgical/Social History: Medications & Allergies reviewed per EMR, new medications updated. Patient Active Problem List   Diagnosis Date Noted   Hyperlipemia 12/28/2022   Hyperglycemia due to type 2 diabetes mellitus (HCC) 12/20/2022   Seasonal allergic rhinitis 10/04/2022   Umbilical hernia without obstruction and without gangrene 06/10/2020   Pelvic relaxation 02/14/2019   Rectal bleeding 04/26/2017   Past Medical History:  Diagnosis Date   Colon polyps    Complication of anesthesia    Diabetes mellitus without complication (HCC)    Diet controlled   Hypercholesteremia    Hypertension    Osteoporosis    PONV (postoperative nausea and vomiting)    Sciatic pain, right  Scoliosis    Family History  Problem Relation Age of Onset   Other Mother        bladder infection   Colon cancer Neg Hx    Past Surgical History:  Procedure Laterality Date   ABDOMINAL HYSTERECTOMY     CATARACT EXTRACTION W/PHACO Left 10/18/2018   Procedure: CATARACT EXTRACTION PHACO AND INTRAOCULAR LENS PLACEMENT  LEFT EYE (CDE: 22.04);  Surgeon: Harrie Agent, MD;  Location: [facility];  Service: Ophthalmology;  Laterality: Left;   CATARACT EXTRACTION W/PHACO Right 11/15/2018   Procedure: CATARACT EXTRACTION PHACO AND INTRAOCULAR LENS PLACEMENT (IOC) (CDE: 23.55);  Surgeon:  Harrie Agent, MD;  Location: [facility];  Service: Ophthalmology;  Laterality: Right;   COLONOSCOPY     COLONOSCOPY N/A 11/21/2012   Procedure: COLONOSCOPY;  Surgeon: Claudis RAYMOND Rivet, MD;  Location: [facility];  Service: Endoscopy;  Laterality: N/A;  1030   COLONOSCOPY N/A 05/25/2017   Procedure: COLONOSCOPY;  Surgeon: Rivet Claudis RAYMOND, MD;  Location: [facility];  Service: Endoscopy;  Laterality: N/A;  830   Left breast biopsy     Left foot     pessary     UMBILICAL HERNIA REPAIR N/A 06/25/2020   Procedure: UMBILICAL HERNIA REPAIR WITH MESH;  Surgeon: Kallie Manuelita BROCKS, MD;  Location: [facility];  Service: General;  Laterality: N/A;   Social History   Occupational History   Not on file  Tobacco Use   Smoking status: Never   Smokeless tobacco: Never  Vaping Use   Vaping status: Never Used  Substance and Sexual Activity   Alcohol use: No   Drug use: No   Sexual activity: Not Currently    Birth control/protection: Surgical    Comment: hyst

## 2023-08-17 NOTE — Progress Notes (Signed)
 Patient says that her pain did improve, although it did not go away completely. She still has pain through the side and the front of the hip. She says that if she puts pressure on the side of the hip, she will feel it down the leg. She still has pain while sleeping on the right side, and says that she still takes Tylenol  in the mornings when she moves to her recliner.

## 2023-09-07 ENCOUNTER — Telehealth: Payer: Self-pay | Admitting: Sports Medicine

## 2023-09-07 NOTE — Telephone Encounter (Signed)
 Patient called just to let you know the injection is working 50%. Thank you she said.

## 2023-09-19 DIAGNOSIS — R350 Frequency of micturition: Secondary | ICD-10-CM | POA: Diagnosis not present

## 2023-09-19 DIAGNOSIS — M810 Age-related osteoporosis without current pathological fracture: Secondary | ICD-10-CM | POA: Diagnosis not present

## 2023-09-19 DIAGNOSIS — N390 Urinary tract infection, site not specified: Secondary | ICD-10-CM | POA: Diagnosis not present

## 2023-09-19 DIAGNOSIS — M419 Scoliosis, unspecified: Secondary | ICD-10-CM | POA: Diagnosis not present

## 2023-09-20 ENCOUNTER — Other Ambulatory Visit: Payer: Self-pay

## 2023-09-20 DIAGNOSIS — M810 Age-related osteoporosis without current pathological fracture: Secondary | ICD-10-CM | POA: Insufficient documentation

## 2023-09-21 ENCOUNTER — Telehealth: Payer: Self-pay

## 2023-09-21 ENCOUNTER — Other Ambulatory Visit (HOSPITAL_COMMUNITY): Payer: Self-pay

## 2023-09-21 NOTE — Telephone Encounter (Signed)
 Auth Submission: DENIED Site of care: Site of care: [facility] Payer: [facility] Medication & CPT/J Code(s) submitted: Prolia (Denosumab) [id number] Diagnosis Code:  Route of submission (phone, fax, portal): portal Phone # Fax # Auth type: Buy/Bill PB Units/visits requested: 60mg  q85months Reference number: [id number]   Authorization has been DENIED because pt must try/fail preferred medications. Denial letter is scanned in the media tab.

## 2023-10-11 ENCOUNTER — Telehealth: Payer: Self-pay

## 2023-10-11 ENCOUNTER — Other Ambulatory Visit (HOSPITAL_COMMUNITY): Payer: Self-pay | Admitting: Internal Medicine

## 2023-10-11 NOTE — Telephone Encounter (Signed)
 Auth Submission: APPROVED Site of care: Site of care: [facility] Payer: [employer] Medication & CPT/J Code(s) submitted: Prolia (Denosumab) [id number] Diagnosis Code:  Route of submission (phone, fax, portal): portal Phone # Fax # Auth type: Buy/Bill PB Units/visits requested: 60mg  x q19months x 3 doses Reference number: [id number] Approval from: 10/11/23 to 10/10/24

## 2023-10-17 ENCOUNTER — Encounter: Attending: Internal Medicine | Admitting: *Deleted

## 2023-10-17 VITALS — BP 161/83 | HR 65 | Temp 97.7°F | Resp 16

## 2023-10-17 DIAGNOSIS — M810 Age-related osteoporosis without current pathological fracture: Secondary | ICD-10-CM

## 2023-10-17 MED ORDER — DENOSUMAB 60 MG/ML ~~LOC~~ SOSY
60.0000 mg | PREFILLED_SYRINGE | Freq: Once | SUBCUTANEOUS | Status: AC
  Administered 2023-10-17: 60 mg via SUBCUTANEOUS

## 2023-10-17 NOTE — Progress Notes (Addendum)
 Diagnosis: Osteoporosis  Provider:  Shona Norleen PEDLAR, MD  Procedure: Injection  Prolia (Denosumab), Dose: 60 mg, Site: subcutaneous, Number of injections: 1  Injection Site(s): Right arm  Post Care: Observation period completed  Discharge: Condition: Good, Destination: Home . AVS Provided  Performed by:  Baldwin Darice Helling, RN

## 2023-11-19 ENCOUNTER — Encounter: Payer: Self-pay | Admitting: Radiology

## 2023-12-10 ENCOUNTER — Encounter: Payer: Self-pay | Admitting: Adult Health

## 2023-12-10 ENCOUNTER — Ambulatory Visit: Admitting: Adult Health

## 2023-12-10 VITALS — BP 157/71 | HR 63 | Ht 60.0 in | Wt 159.0 lb

## 2023-12-10 DIAGNOSIS — Z87440 Personal history of urinary (tract) infections: Secondary | ICD-10-CM | POA: Insufficient documentation

## 2023-12-10 DIAGNOSIS — I10 Essential (primary) hypertension: Secondary | ICD-10-CM | POA: Insufficient documentation

## 2023-12-10 DIAGNOSIS — N993 Prolapse of vaginal vault after hysterectomy: Secondary | ICD-10-CM | POA: Insufficient documentation

## 2023-12-10 DIAGNOSIS — Z4689 Encounter for fitting and adjustment of other specified devices: Secondary | ICD-10-CM | POA: Diagnosis not present

## 2023-12-10 NOTE — Progress Notes (Signed)
  Subjective:     Patient ID: Shelia Pierce, female   DOB: 07/29/1940, 83 y.o.   MRN: [id number]  HPI Shelia Pierce is a 83 year old white female, married, sp hysterectomy in for pessary maintenance. She had UTI last week.  PCP is Dr Shona   Review of Systems For pessary maintenance Denies any vaginal bleeding, has a little discharge since UTI Reviewed past medical,surgical, social and family history. Reviewed medications and allergies.     Objective:   Physical Exam BP (!) 157/71 (BP Location: Left Arm, Patient Position: Sitting, Cuff Size: Normal)   Pulse 63   Ht 5' (1.524 m)   Wt 159 lb (72.1 kg)   BMI 31.05 kg/m     Skin warm and dry.Pelvic: external genitalia is normal in appearance no lesions, vagina: #5 ring with support pessary removed and washed with soap and water  and dried, no odor blood or discharge, +prolapse, no irritation noted, pessary reinserted. Fall risk is low  Upstream - 12/10/23 1221       Pregnancy Intention Screening   Does the patient want to become pregnant in the next year? N/A    Does the patient's partner want to become pregnant in the next year? N/A    Would the patient like to discuss contraceptive options today? N/A      Contraception Wrap Up   Current Method Abstinence;Hysterectomy    End Method Abstinence;Hysterectomy    Contraception Counseling Provided No         Examination chaperoned by Clarita Salt LPN  Assessment:     1. Pessary maintenance Milex ring with support #5 (Primary) Cleaned and replaced   2. 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 History of UTI Finish meds  Has been on macrobid  50 mg daily also but stopped when started bactrim  DS, has follow up with PCP  4. Hypertension, unspecified type Continue meds and follow up with PCP    Plan:     Return in 4 months for pessary maintenance

## 2023-12-24 ENCOUNTER — Telehealth: Payer: Self-pay | Admitting: Obstetrics & Gynecology

## 2023-12-24 NOTE — Telephone Encounter (Signed)
 Pt states she would like a call back about medication. Please advise

## 2024-02-04 ENCOUNTER — Other Ambulatory Visit (HOSPITAL_COMMUNITY): Payer: Self-pay | Admitting: Internal Medicine

## 2024-02-04 ENCOUNTER — Encounter: Payer: Self-pay | Admitting: Internal Medicine

## 2024-02-04 DIAGNOSIS — Z1231 Encounter for screening mammogram for malignant neoplasm of breast: Secondary | ICD-10-CM

## 2024-02-20 ENCOUNTER — Encounter: Payer: Self-pay | Admitting: Internal Medicine

## 2024-02-27 ENCOUNTER — Ambulatory Visit (HOSPITAL_COMMUNITY)

## 2024-04-17 ENCOUNTER — Ambulatory Visit
